# Patient Record
Sex: Male | Born: 1965 | Hispanic: Yes | Marital: Single | Smoking: Never smoker
Health system: Southern US, Community
[De-identification: ages and names within clinical notes are randomized; demographics above are authoritative.]

## PROBLEM LIST (undated history)

## (undated) DIAGNOSIS — Z9989 Dependence on other enabling machines and devices: Secondary | ICD-10-CM

## (undated) DIAGNOSIS — E538 Deficiency of other specified B group vitamins: Secondary | ICD-10-CM

## (undated) DIAGNOSIS — G4733 Obstructive sleep apnea (adult) (pediatric): Secondary | ICD-10-CM

## (undated) DIAGNOSIS — I10 Essential (primary) hypertension: Secondary | ICD-10-CM

## (undated) DIAGNOSIS — J45909 Unspecified asthma, uncomplicated: Secondary | ICD-10-CM

## (undated) DIAGNOSIS — E119 Type 2 diabetes mellitus without complications: Secondary | ICD-10-CM

## (undated) DIAGNOSIS — E785 Hyperlipidemia, unspecified: Secondary | ICD-10-CM

## (undated) DIAGNOSIS — E669 Obesity, unspecified: Secondary | ICD-10-CM

## (undated) DIAGNOSIS — I639 Cerebral infarction, unspecified: Secondary | ICD-10-CM

## (undated) HISTORY — PX: EYE SURGERY: SHX253

## (undated) HISTORY — DX: Cerebral infarction, unspecified: I63.9

## (undated) SURGERY — Surgical Case
Anesthesia: *Unknown

---

## 2015-04-05 ENCOUNTER — Emergency Department: Payer: Commercial Managed Care - PPO

## 2015-04-05 ENCOUNTER — Emergency Department
Admission: EM | Admit: 2015-04-05 | Discharge: 2015-04-05 | Disposition: A | Discharge status: Home / Self Care | Payer: Commercial Managed Care - PPO | Attending: Emergency Medicine | Admitting: Emergency Medicine

## 2015-04-05 ENCOUNTER — Encounter: Payer: Self-pay | Admitting: *Deleted

## 2015-04-05 DIAGNOSIS — R531 Weakness: Secondary | ICD-10-CM | POA: Diagnosis present

## 2015-04-05 DIAGNOSIS — E119 Type 2 diabetes mellitus without complications: Secondary | ICD-10-CM | POA: Diagnosis not present

## 2015-04-05 DIAGNOSIS — Z794 Long term (current) use of insulin: Secondary | ICD-10-CM | POA: Insufficient documentation

## 2015-04-05 DIAGNOSIS — G629 Polyneuropathy, unspecified: Secondary | ICD-10-CM | POA: Diagnosis not present

## 2015-04-05 DIAGNOSIS — I10 Essential (primary) hypertension: Secondary | ICD-10-CM | POA: Diagnosis not present

## 2015-04-05 DIAGNOSIS — Z79899 Other long term (current) drug therapy: Secondary | ICD-10-CM | POA: Insufficient documentation

## 2015-04-05 HISTORY — DX: Hyperlipidemia, unspecified: E78.5

## 2015-04-05 HISTORY — DX: Obesity, unspecified: E66.9

## 2015-04-05 HISTORY — DX: Essential (primary) hypertension: I10

## 2015-04-05 HISTORY — DX: Type 2 diabetes mellitus without complications: E11.9

## 2015-04-05 LAB — CBC WITH DIFFERENTIAL/PLATELET
Basophils Absolute: 0 10*3/uL (ref 0–0.1)
Basophils Relative: 0 %
Eosinophils Absolute: 0.1 10*3/uL (ref 0–0.7)
Eosinophils Relative: 1 %
HCT: 42.7 % (ref 40.0–52.0)
Hemoglobin: 14.4 g/dL (ref 13.0–18.0)
LYMPHS ABS: 2.1 10*3/uL (ref 1.0–3.6)
Lymphocytes Relative: 19 %
MCH: 32.6 pg (ref 26.0–34.0)
MCHC: 33.8 g/dL (ref 32.0–36.0)
MCV: 96.3 fL (ref 80.0–100.0)
MONOS PCT: 8 %
Monocytes Absolute: 0.9 10*3/uL (ref 0.2–1.0)
Neutro Abs: 7.9 10*3/uL — ABNORMAL HIGH (ref 1.4–6.5)
Neutrophils Relative %: 72 %
Platelets: 273 10*3/uL (ref 150–440)
RBC: 4.43 MIL/uL (ref 4.40–5.90)
RDW: 13.3 % (ref 11.5–14.5)
WBC: 11.2 10*3/uL — AB (ref 3.8–10.6)

## 2015-04-05 LAB — COMPREHENSIVE METABOLIC PANEL
ALT: 23 U/L (ref 17–63)
AST: 27 U/L (ref 15–41)
Albumin: 3.9 g/dL (ref 3.5–5.0)
Alkaline Phosphatase: 55 U/L (ref 38–126)
Anion gap: 9 (ref 5–15)
BUN: 16 mg/dL (ref 6–20)
CHLORIDE: 104 mmol/L (ref 101–111)
CO2: 26 mmol/L (ref 22–32)
Calcium: 9 mg/dL (ref 8.9–10.3)
Creatinine, Ser: 0.82 mg/dL (ref 0.61–1.24)
GFR calc Af Amer: >60 mL/min (ref >60)
GFR calc non Af Amer: >60 mL/min (ref >60)
GLUCOSE: 162 mg/dL — AB (ref 65–99)
Potassium: 3.7 mmol/L (ref 3.5–5.1)
Sodium: 139 mmol/L (ref 135–145)
TOTAL PROTEIN: 7.3 g/dL (ref 6.5–8.1)
Total Bilirubin: 0.9 mg/dL (ref 0.3–1.2)

## 2015-04-05 LAB — MAGNESIUM: MAGNESIUM: 1.6 mg/dL — AB (ref 1.7–2.4)

## 2015-04-05 LAB — TROPONIN I
TROPONIN I: 0.03 ng/mL (ref <0.031)
Troponin I: <0.03 ng/mL (ref <0.031)

## 2015-04-05 MED ORDER — MAGNESIUM SULFATE 2 GM/50ML IV SOLN
2.0000 g | Freq: Once | INTRAVENOUS | Status: AC
  Administered 2015-04-05: 2 g via INTRAVENOUS
  Filled 2015-04-05: qty 50

## 2015-04-05 MED ORDER — ASPIRIN 81 MG PO CHEW
324.0000 mg | CHEWABLE_TABLET | Freq: Once | ORAL | Status: AC
  Administered 2015-04-05: 324 mg via ORAL
  Filled 2015-04-05: qty 4

## 2015-04-05 NOTE — ED Notes (Addendum)
Pt ambulatory to triage.  Pt has weakness since 1300 today.  States right leg felt like it was giving out.  States it feels heavy.  No headache. Speech clear.  No n/v/d.  Pt is diabetic.  No dizziness.  Pt states he checked his FSBS earlier and it was 170. Pt to ctscan from triage via wheelchair.

## 2015-04-05 NOTE — ED Notes (Signed)
AAOx3.  Skin warm and dry.   

## 2015-04-05 NOTE — ED Notes (Signed)
AAOx3.  Skin warm and dry.  Moving all extremities equally and strong.  Gait steady. 

## 2015-04-05 NOTE — ED Provider Notes (Signed)
[facility] Emergency Department Provider Note  ____________________________________________  Time seen: Approximately 4:03 PM  I have reviewed the triage vital signs and the nursing notes.   HISTORY  Chief Complaint Weakness    HPI Roger [name] is a 49 y.o. male with a history of obesity, insulin-dependent diabetes, hypertension, and hyperlipidemia who presents with acute onset of right leg weakness/heaviness.  He states that shortly after lunch he had an acute onset of feeling like his right leg would give out, and it buckled once while he was walking.  She will thereafter that feeling evolved into a feeling of heaviness primarily in his right leg, though he wonders if maybe he felt a little bit of weakness in his right arm as well.  He denies headache, numbness/tingling, back pain, chest pain, nausea/vomiting, shortness of breath, fever/chills.  He has not had any issues with word finding or speech.  He describes his symptoms as moderate initially but mild now.   Past Medical History  Diagnosis Date  . Hypertension   . Diabetes mellitus without complication     insulin-dependent  . Hyperlipidemia   . Obesity     There are no active problems to display for this patient.   Past Surgical History  Procedure Laterality Date  . Eye surgery Right     Corneal implant    Current Outpatient Rx  Name  Route  Sig  Dispense  Refill  . Exenatide ER (BYDUREON) 2 MG PEN   Subcutaneous   Inject 2 mg into the skin once a week. On sundays         . furosemide (LASIX) 20 MG tablet   Oral   Take 1 tablet by mouth daily.      0   . insulin glargine (LANTUS) 100 UNIT/ML injection   Subcutaneous   Inject 20-25 Units into the skin 2 (two) times daily. 25 units in the morning and 20 units at bedtime.         Marland Kitchen losartan (COZAAR) 100 MG tablet   Oral   Take 1 tablet by mouth daily.      0   . metFORMIN (GLUCOPHAGE) 500 MG tablet   Oral   Take 2,000  mg by mouth 2 (two) times daily.         . metoprolol succinate (TOPROL-XL) 50 MG 24 hr tablet   Oral   Take 1 tablet by mouth daily.      0   . niacin (NIASPAN) 500 MG CR tablet   Oral   Take 1,500 mg by mouth at bedtime.      0   . simvastatin (ZOCOR) 20 MG tablet   Oral   Take 1 tablet by mouth at bedtime.      0     Allergies Review of patient's allergies indicates no known allergies.  History reviewed. No pertinent family history.  Social History History  Substance Use Topics  . Smoking status: Never Smoker   . Smokeless tobacco: Not on file  . Alcohol Use: No    Review of Systems Constitutional: No fever/chills Eyes: No visual changes. ENT: No sore throat. Cardiovascular: Denies chest pain. Respiratory: Denies shortness of breath. Gastrointestinal: No abdominal pain.  No nausea, no vomiting.  No diarrhea.  No constipation. Genitourinary: Negative for dysuria. Musculoskeletal: Negative for back pain. Skin: Negative for rash. Neurological: No numbness nor tingling.  Patient describes heaviness in his right side, primarily his right leg.  10-point ROS otherwise negative.  ____________________________________________   PHYSICAL EXAM:  VITAL SIGNS: ED Triage Vitals  Enc Vitals Group     BP 04/05/15 1512 182/93 mmHg     Pulse Rate 04/05/15 1512 88     Resp 04/05/15 1512 18     Temp 04/05/15 1512 98.1 F (36.7 C)     Temp Source 04/05/15 1512 Oral     SpO2 04/05/15 1512 99 %     Weight 04/05/15 1512 228 lb (103.42 kg)     Height 04/05/15 1512 5\' 7"  (1.702 m)     Head Cir --      Peak Flow --      Pain Score --      Pain Loc --      Pain Edu? --      Excl. in GC? --     Constitutional: Alert and oriented. Well appearing and in no acute distress. Eyes: Conjunctivae are normal. PERRL. EOMI. Head: Atraumatic. Nose: No congestion/rhinnorhea. Mouth/Throat: Mucous membranes are moist.  Oropharynx non-erythematous. Neck: No stridor.    Cardiovascular: Normal rate, regular rhythm. Grossly normal heart sounds.  Good peripheral circulation. Respiratory: Normal respiratory effort.  No retractions. Lungs CTAB. Gastrointestinal: Soft and nontender. No distention. No abdominal bruits. No CVA tenderness. Musculoskeletal: No lower extremity tenderness nor edema.  No joint effusions. Neurologic:  Normal speech and language. No gross focal neurologic deficits are appreciated:  Cranial nerves are grossly intact.  No dysarthria nor dysphagia.  Brachioradialis and patellar reflexes are normal and equal bilaterally.  Gait is stable and normal.  No cerebellar dysfunction appreciated.  Strength is intact in bilateral upper and lower extremities. Skin:  Skin is warm, dry and intact. No rash noted. Psychiatric: Mood and affect are normal. Speech and behavior are normal.  ____________________________________________   LABS (all labs ordered are listed, but only abnormal results are displayed)  Labs Reviewed  COMPREHENSIVE METABOLIC PANEL - Abnormal; Notable for the following:    Glucose, Bld 162 (*)    All other components within normal limits  MAGNESIUM - Abnormal; Notable for the following:    Magnesium 1.6 (*)    All other components within normal limits  CBC WITH DIFFERENTIAL/PLATELET - Abnormal; Notable for the following:    WBC 11.2 (*)    Neutro Abs 7.9 (*)    All other components within normal limits  TROPONIN I  TROPONIN I   ____________________________________________  EKG  ED ECG REPORT I, Margret Moat, the attending physician, personally viewed and interpreted this ECG.  Date: 04/05/2015 EKG Time: 15:37 Rate: 89 Rhythm: normal sinus rhythm QRS Axis: normal Intervals: normal ST/T Wave abnormalities: normal Conduction Disutrbances: none Narrative Interpretation: unremarkable  ____________________________________________  RADIOLOGY  Aura Camps, Amani Nodarse, personally discussed these images and results by phone  with the on-call radiologist and used this discussion as part of my medical decision making.   Ct Head Wo Contrast  04/05/2015   ADDENDUM REPORT: 04/05/2015 15:47  ADDENDUM: Study discussed by telephone with Dr. Loleta Rose on 04/05/2015 at 1537 hours.   Electronically Signed   By: Odessa Fleming M.D.   On: 04/05/2015 15:47   04/05/2015   CLINICAL DATA:  49 year old male with right side weakness since 1300 hours. Code stroke. Initial encounter.  EXAM: CT HEAD WITHOUT CONTRAST  TECHNIQUE: Contiguous axial images were obtained from the base of the skull through the vertex without intravenous contrast.  COMPARISON:  None.  FINDINGS: Partially visible left maxillary mucous retention cyst. Other Visualized paranasal sinuses and mastoids are clear.  No acute osseous abnormality identified. Visualized orbits and scalp soft tissues are within normal limits.  Mild Calcified atherosclerosis at the skull base. Cerebral volume is within normal limits for age. No midline shift, mass effect, or evidence of intracranial mass lesion. No ventriculomegaly. Normal gray-white matter differentiation. No cortically based acute infarct identified. No acute intracranial hemorrhage identified. No suspicious intracranial vascular hyperdensity.  IMPRESSION: Normal noncontrast CT appearance of the brain.  Electronically Signed: By: Odessa Fleming M.D. On: 04/05/2015 15:37    ____________________________________________   PROCEDURES  Procedure(s) performed: None  Critical Care performed: No   NIH Stroke Scale  Interval: Baseline Time: 7:49 PM Person Administering Scale: Mckenzye Cutright  Administer stroke scale items in the order listed. Record performance in each category after each subscale exam. Do not go back and change scores. Follow directions provided for each exam technique. Scores should reflect what the patient does, not what the clinician thinks the patient can do. The clinician should record answers while administering the exam  and work quickly. Except where indicated, the patient should not be coached (i.e., repeated requests to patient to make a special effort).   1a  Level of consciousness: 0=alert; keenly responsive  1b. LOC questions:  0=Performs both tasks correctly  1c. LOC commands: 0=Performs both tasks correctly  2.  Best Gaze: 0=normal  3.  Visual: 0=No visual loss  4. Facial Palsy: 0=Normal symmetric movement  5a.  Motor left arm: 0=No drift, limb holds 90 (or 45) degrees for full 10 seconds  5b.  Motor right arm: 0=No drift, limb holds 90 (or 45) degrees for full 10 seconds  6a. motor left leg: 0=No drift, limb holds 90 (or 45) degrees for full 10 seconds  6b  Motor right leg:  0=No drift, limb holds 90 (or 45) degrees for full 10 seconds  7. Limb Ataxia: 0=Absent  8.  Sensory: 0=Normal; no sensory loss  9. Best Language:  0=No aphasia, normal  10. Dysarthria: 0=Normal  11. Extinction and Inattention: 0=No abnormality  12. Distal motor function: 0=Normal   Total:   0   The patient's NIH stroke scale is 0 and he does not qualify for TPA because I do not believe he is having a CVA, his symptoms are mild, and they are improving. ____________________________________________      INITIAL IMPRESSION / ASSESSMENT AND PLAN / ED COURSE  Pertinent labs & imaging results that were available during my care of the patient were reviewed by me and considered in my medical decision making (see chart for details).  The patient has normal vital signs other than hypertension, has a normal head CT which I discussed by phone with radiologist, and has no appreciable neurological deficits on extensive evaluation.  I do not believe there is indication for additional neurological imaging at this time.  While it is possible that he may have suffered a TIA, I find this to be unlikely given his age and unremarkable exam and only mildly suggestive history.  He has no back pain to suggest a nerve or cord compression.  He  has no infectious signs or symptoms as well.  ----------------------------------------- 5:13 PM on 04/05/2015 -----------------------------------------  The patient is "fine" on reassessment.  His labs were notable only for a slight magnesium deficiency and a troponin of 0.03.  He has no signs or symptoms of ACS.  I will repeat a troponin at the 3 hour mark to make sure that it remained stable.  I will also provide some  additional magnesium.  I informed the patient that we are repeating the labs to make sure that nothing changes over time and he is comfortable with this plan.  ----------------------------------------- 7:49 PM on 04/05/2015 -----------------------------------------  The patient feels better and reports improvement of his leg heaviness.  We discussed his results again including his 2 negative troponins.  He will follow up as an outpatient.  I recommended that he take aspirin as an outpatient and follow up with his primary care doctor to determine if additional outpatient evaluation is needed.  I gave him and his mother my usual and customary return precautions.    ____________________________________________  FINAL CLINICAL IMPRESSION(S) / ED DIAGNOSES  Final diagnoses:  Peripheral neuropathy      NEW MEDICATIONS STARTED DURING THIS VISIT:  New Prescriptions   No medications on file     Loleta Rose, MD 04/05/15 1949

## 2015-04-05 NOTE — ED Notes (Signed)
AAOx3.  Skin warm and dry.  Moving all extremities equally and strong. Ambulates with easy and steady gait.   

## 2015-04-05 NOTE — Discharge Instructions (Signed)
As we discussed, your workup today was reassuring.  The we do not know exactly what is causing your symptoms, it appears that you have no emergent medical condition at this time are safe to go home and follow up as recommended in this paperwork.  While it is POSSIBLE that you may have peripheral neuropathy or, less likely, a transient ischemic attack (TIA), we included information for you to read about other conditions.  It is important that you follow-up with her primary care doctor at the next available opportunity to decide if additional testing as an outpatient is needed.  In the meantime, I recommend that you take a daily full dose (325 mg) aspirin.  Please return immediately to the Emergency Department if you develop any new or worsening symptoms that concern you.

## 2015-04-25 ENCOUNTER — Telehealth: Payer: Self-pay | Admitting: Neurology

## 2015-04-25 ENCOUNTER — Encounter: Payer: Self-pay | Admitting: Neurology

## 2015-04-25 ENCOUNTER — Ambulatory Visit (INDEPENDENT_AMBULATORY_CARE_PROVIDER_SITE_OTHER): Payer: Commercial Managed Care - PPO | Admitting: Neurology

## 2015-04-25 ENCOUNTER — Telehealth: Payer: Self-pay

## 2015-04-25 VITALS — BP 158/97 | HR 87 | Ht 67.0 in | Wt 230.2 lb

## 2015-04-25 DIAGNOSIS — G5602 Carpal tunnel syndrome, left upper limb: Secondary | ICD-10-CM | POA: Diagnosis not present

## 2015-04-25 DIAGNOSIS — R202 Paresthesia of skin: Secondary | ICD-10-CM | POA: Diagnosis not present

## 2015-04-25 DIAGNOSIS — E538 Deficiency of other specified B group vitamins: Secondary | ICD-10-CM | POA: Insufficient documentation

## 2015-04-25 DIAGNOSIS — R260 Ataxic gait: Secondary | ICD-10-CM | POA: Diagnosis not present

## 2015-04-25 DIAGNOSIS — G5600 Carpal tunnel syndrome, unspecified upper limb: Secondary | ICD-10-CM | POA: Insufficient documentation

## 2015-04-25 MED ORDER — TOPIRAMATE 50 MG PO TABS: 100.0000 mg | ORAL_TABLET | Freq: Two times a day (BID) | ORAL | Status: DC

## 2015-04-25 NOTE — Telephone Encounter (Signed)
Patient called, Dr. Dario Guardian needs something in writing from Dr. Pearlean Brownie stating that it's ok to take B12 shots. Pt would like for this to be taken care of ASAP.

## 2015-04-25 NOTE — Patient Instructions (Addendum)
I had a long discussion with the patient regarding his symptoms of hand paresthesias as well as new complaints of gait ataxia and falling. These could perhaps related to B12 deficiency but given progressive nature I would like to image his brain and cervical spine and rule out other structural or vascular causes. On exam he also has a component of carpal tunnel syndrome in the left hand. Check MRI scan of the brain, cervical spine, EMG nerve conduction study, B12, TSH, ANA and ESR. I have advised the patient to wear wrist extension splint on the left wrist and all the times for carpal tunnel. Trial of Topamax 50 mg at night for 1 week increase if tolerated without side effects to twice daily. I discussed possible side effects with the patient. He was advised to return for follow-up in 2 months or call earlier if necessary.  Carpal Tunnel Syndrome The carpal tunnel is a narrow area located on the palm side of your wrist. The tunnel is formed by the wrist bones and ligaments. Nerves, blood vessels, and tendons pass through the carpal tunnel. Repeated wrist motion or certain diseases may cause swelling within the tunnel. This swelling pinches the main nerve in the wrist (median nerve) and causes the painful hand and arm condition called carpal tunnel syndrome. CAUSES   Repeated wrist motions.  Wrist injuries.  Certain diseases like arthritis, diabetes, alcoholism, hyperthyroidism, and kidney failure.  Obesity.  Pregnancy. SYMPTOMS   A "pins and needles" feeling in your fingers or hand, especially in your thumb, index and middle fingers.  Tingling or numbness in your fingers or hand.  An aching feeling in your entire arm, especially when your wrist and elbow are bent for long periods of time.  Wrist pain that goes up your arm to your shoulder.  Pain that goes down into your palm or fingers.  A weak feeling in your hands. DIAGNOSIS  Your health care provider will take your history and perform  a physical exam. An electromyography test may be needed. This test measures electrical signals sent out by your nerves into the muscles. The electrical signals are usually slowed by carpal tunnel syndrome. You may also need X-rays. TREATMENT  Carpal tunnel syndrome may clear up by itself. Your health care provider may recommend a wrist splint or medicine such as a nonsteroidal anti-inflammatory medicine. Cortisone injections may help. Sometimes, surgery may be needed to free the pinched nerve.  HOME CARE INSTRUCTIONS   Take all medicine as directed by your health care provider. Only take over-the-counter or prescription medicines for pain, discomfort, or fever as directed by your health care provider.  If you were given a splint to keep your wrist from bending, wear it as directed. It is important to wear the splint at night. Wear the splint for as long as you have pain or numbness in your hand, arm, or wrist. This may take 1 to 2 months.  Rest your wrist from any activity that may be causing your pain. If your symptoms are work-related, you may need to talk to your employer about changing to a job that does not require using your wrist.  Put ice on your wrist after long periods of wrist activity.  Put ice in a plastic bag.  Place a towel between your skin and the bag.  Leave the ice on for 15-20 minutes, 03-04 times a day.  Keep all follow-up visits as directed by your health care provider. This includes any orthopedic referrals, physical therapy, and  rehabilitation. Any delay in getting necessary care could result in a delay or failure of your condition to heal. SEEK IMMEDIATE MEDICAL CARE IF:   You have new, unexplained symptoms.  Your symptoms get worse and are not helped or controlled with medicines. MAKE SURE YOU:   Understand these instructions.  Will watch your condition.  Will get help right away if you are not doing well or get worse. Document Released: 08/10/2000 Document  Revised: 12/28/2013 Document Reviewed: 06/29/2011 [facility] Patient Information 2015 [place], [place]. This information is not intended to replace advice given to you by your health care provider. Make sure you discuss any questions you have with your health care provider.

## 2015-04-25 NOTE — Progress Notes (Signed)
[facility] [address] [place]. [place] [address] [phone]       OFFICE CONSULT NOTE  Mr. Lyndall Windt Date of Birth:  [birth year] Medical Record Number:  [id number]   Referring MD:   Rosario Jacks Reason for Referral:  Tingling and gait ataxia HPI:  Mr Manetta is a 49 year old male whose had for the last 3 weeks subacute progressive paresthesias mostly in the left hand but to lesser degree in the right hand as well. He states this began about 3 weeks ago when he noticed weakness on his whole left side not just his hand this lasted only 10 minutes and recovered he subsequently had tickling sensation in his left hand and to lesser extent the right hand. Next a noticed a little heaviness on the right side and his right leg was buckling. He was seen in the emergency room at [facility] on 04/05/2015 and had unremarkable exam. Basic metabolic panel labs and CBC unremarkable. CT scan of the head which have personally reviewed was normal. He was subsequently seen by his primary physician and underwent a bunch of tests including carotid ultrasound, transthoracic echo and nuclear stress test all of which were normal. I do not have the results to review today. He had lab work done on 04/05/15 growing vitamin B12 which came slightly low-normal at 209. Patient has noticed for the last several days that he stumbles a lot. His gait is off balance. At times he can even drop objects from his left hand is he has so much tingling. This tingling seems to be more pronounced at night and at times he cannot sleep because of this. 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 He does have long-standing history of diabetes in which was poorly controlled in the past with lasted about an A1c being 7.9 but after being started on insulin he is doing better and more recently his fasting sugars have ranged from 90-120. He does have history of mild tingling  in his feet but he does not feel that this has gotten worse. He has not tried any specific medications for paresthesias in his hand. He denies history of any head injury, headache, neck pain, radicular pain. He does admit to significant fatigue over the last several months but denies any loss of vision, vertigo, diplopia, bladder urgency or incontinence.  ROS:   14 system review of systems is positive for tingling, numbness, gait imbalance, frequent falls, fatigue and all other systems negative  PMH:  Past Medical History  Diagnosis Date  . Hypertension   . Diabetes mellitus without complication     insulin-dependent  . Hyperlipidemia   . Obesity     Social History:  Social History   Social History  . Marital Status: Single    Spouse Name: N/A  . Number of Children: N/A  . Years of Education: N/A   Occupational History  . Not on file.   Social History Main Topics  . Smoking status: Never Smoker   . Smokeless tobacco: Not on file  . Alcohol Use: No  . Drug Use: Not on file  . Sexual Activity: Not on file   Other Topics Concern  . Not on file   Social History Narrative    Medications:   Current Outpatient Prescriptions on File Prior to Visit  Medication Sig Dispense Refill  . Exenatide ER (BYDUREON) 2 MG PEN Inject 2 mg into the skin once  a week. On sundays    . furosemide (LASIX) 20 MG tablet Take 1 tablet by mouth daily.  0  . insulin glargine (LANTUS) 100 UNIT/ML injection Inject 20-25 Units into the skin 2 (two) times daily. 25 units in the morning and 20 units at bedtime.    Marland Kitchen losartan (COZAAR) 100 MG tablet Take 1 tablet by mouth daily.  0  . metFORMIN (GLUCOPHAGE) 500 MG tablet Take 2,000 mg by mouth 2 (two) times daily.    . metoprolol succinate (TOPROL-XL) 50 MG 24 hr tablet Take 1 tablet by mouth daily.  0  . niacin (NIASPAN) 500 MG CR tablet Take 1,500 mg by mouth at bedtime.  0  . simvastatin (ZOCOR) 20 MG tablet Take 1 tablet by mouth at bedtime.  0   No  current facility-administered medications on file prior to visit.    Allergies:  No Known Allergies  Physical Exam General: Obese young male seated, in no evident distress Head: head normocephalic and atraumatic.   Neck: supple with no carotid or supraclavicular bruits Cardiovascular: regular rate and rhythm, no murmurs Musculoskeletal: no deformity Skin:  no rash/petichiae Vascular:  Normal pulses all extremities  Neurologic Exam Mental Status: Awake and fully alert. Oriented to place and time. Recent and remote memory intact. Attention span, concentration and fund of knowledge appropriate. Mood and affect appropriate.  Cranial Nerves: Fundoscopic exam reveals sharp disc margins. Pupils equal, briskly reactive to light. Extraocular movements full without nystagmus. Visual fields full to confrontation. Hearing intact. Facial sensation intact. Face, tongue, palate moves normally and symmetrically.  Motor: Normal bulk and tone. Normal strength in all tested extremity muscles. Sensory.: intact to touch , pinprick , position and vibratory sensation. Positive Tinel's sign over left wrist Coordination: Rapid alternating movements normal in all extremities. Finger-to-nose and heel-to-shin performed accurately bilaterally. Gait and Station: Arises from chair with slight difficulty. Stance is broad based. Gait demonstrates mild imbalance . Unable to heel, toe and tandem walk without difficulty.  Reflexes: 2+  brsik and symmetric. Right Plantar downgoing and left is equivocal.       ASSESSMENT: 49 year male with three-week history of progressive left greater than right hand paresthesias as well as more recently gait and balance difficulties with frequent falls of undetermined etiology. Recent finding of B12 deficiency may be contributing but focal symptoms with left hand paresthesias and right leg buckling would be unusual and evaluation for other causes is necessary. He does have signs and symptoms  of carpal tunnel in the left wrist.    PLAN: I had a long discussion with the patient regarding his symptoms of hand paresthesias as well as new complaints of gait ataxia and falling. These could perhaps  at least partly berelated to B12 deficiency but given progressive nature I would like to image his brain and cervical spine and rule out other structural or vascular causes. On exam he also has a component of carpal tunnel syndrome in the left hand. Check MRI scan of the brain, cervical spine, EMG nerve conduction study, B12, methylmalonic acid,TSH, ANA and ESR. I have advised the patient to wear wrist extension splint on the left wrist and all the times for carpal tunnel. Trial of Topamax 50 mg at night for 1 week increase if tolerated without side effects to twice daily. I discussed possible side effects with the patient. I have also advised the patient to start B12 injections from his primary physician. Greater than 50% of time during this 45 minute consult  was spent on counseling and coordination of care. He was advised to return for follow-up in 2 months or call earlier if necessary.  Antony Contras, MD Note: This document was prepared with digital dictation and possible smart phrase technology. Any transcriptional errors that result from this process are unintentional.

## 2015-04-25 NOTE — Telephone Encounter (Signed)
RN call patient back to discuss his letter for his primary concerning vitamin b 12 shot. Rn also notify patient that he had blood work order from Dr. Pearlean Brownie. Pt stated he will try to come back for blood work in the coming weeks.While on the phone, the call became disconnected.

## 2015-04-25 NOTE — Telephone Encounter (Signed)
If patients call back he needs to come in to get his blood work done. He saw Dr Pearlean Brownie for an appt on 04-25-15.   Lft vm for patient about getting his labs done. Pt has visit and did not get his blood work drawn.

## 2015-04-25 NOTE — Telephone Encounter (Signed)
Nurse left voce message for patient to return phone call. Nurse needs fax number and phone number for his PCP.

## 2015-04-26 NOTE — Telephone Encounter (Signed)
Rn receive call from patient concerning letter for his primary doctor. Pt wants the letter to state that its okay for him to take the b12 injections per Dr. Pearlean Brownie. Number for Dr. Dario Guardian is [phone].

## 2015-04-26 NOTE — Telephone Encounter (Signed)
Okay to do the letter saying that patient should get B12 shots from his primary physician and he should not drive till he feels better

## 2015-04-27 NOTE — Telephone Encounter (Signed)
Patient is calling back about getting in today for B12 shot here in our office vs. PCP office since PCP is gone today.

## 2015-04-27 NOTE — Telephone Encounter (Signed)
Nurse call patients primary doctor Dr. Sherrie Mustache office. The front desk stated the office is closed and Dr.fayegh Dario Guardian will be in the office tomorrow. Letter was fax to md to get approval to receive b12 shots from his PCP. Nurse ask Dr. Pearlean Brownie if patient could receive b12 at [facility] and he referred the patient back to his primary.

## 2015-04-27 NOTE — Telephone Encounter (Signed)
Patient called regarding blood work to be done. Please call and advise. He can be reached at [phone]

## 2015-04-27 NOTE — Telephone Encounter (Signed)
Patient called back requesting to get B12 shot here in our office today and labwork at same time since his PCP is out of the office. Patient states "He can barely walk".

## 2015-04-28 NOTE — Telephone Encounter (Signed)
Rn talk to patient about his b12 injection. Pt stated he went to a different doctor and receive the injection. Pt stated he is fine now,and will come in next week to have his lab work done [facility] that he forgot at the last visit.

## 2015-05-13 ENCOUNTER — Emergency Department: Payer: Commercial Managed Care - PPO

## 2015-05-13 ENCOUNTER — Inpatient Hospital Stay
Admission: EM | Admit: 2015-05-13 | Discharge: 2015-05-16 | DRG: 066 | Disposition: A | Discharge status: Home / Self Care | Payer: Commercial Managed Care - PPO | Attending: Internal Medicine | Admitting: Internal Medicine

## 2015-05-13 ENCOUNTER — Encounter: Payer: Self-pay | Admitting: Internal Medicine

## 2015-05-13 DIAGNOSIS — J45909 Unspecified asthma, uncomplicated: Secondary | ICD-10-CM | POA: Diagnosis present

## 2015-05-13 DIAGNOSIS — I639 Cerebral infarction, unspecified: Secondary | ICD-10-CM

## 2015-05-13 DIAGNOSIS — E538 Deficiency of other specified B group vitamins: Secondary | ICD-10-CM | POA: Diagnosis present

## 2015-05-13 DIAGNOSIS — E119 Type 2 diabetes mellitus without complications: Secondary | ICD-10-CM | POA: Diagnosis present

## 2015-05-13 DIAGNOSIS — Z794 Long term (current) use of insulin: Secondary | ICD-10-CM | POA: Diagnosis not present

## 2015-05-13 DIAGNOSIS — E785 Hyperlipidemia, unspecified: Secondary | ICD-10-CM | POA: Diagnosis present

## 2015-05-13 DIAGNOSIS — Z833 Family history of diabetes mellitus: Secondary | ICD-10-CM | POA: Diagnosis not present

## 2015-05-13 DIAGNOSIS — I63519 Cerebral infarction due to unspecified occlusion or stenosis of unspecified middle cerebral artery: Secondary | ICD-10-CM | POA: Diagnosis present

## 2015-05-13 DIAGNOSIS — Z7982 Long term (current) use of aspirin: Secondary | ICD-10-CM

## 2015-05-13 DIAGNOSIS — I340 Nonrheumatic mitral (valve) insufficiency: Secondary | ICD-10-CM | POA: Diagnosis present

## 2015-05-13 DIAGNOSIS — R4701 Aphasia: Secondary | ICD-10-CM | POA: Diagnosis present

## 2015-05-13 DIAGNOSIS — Z6835 Body mass index (BMI) 35.0-35.9, adult: Secondary | ICD-10-CM | POA: Diagnosis not present

## 2015-05-13 DIAGNOSIS — E669 Obesity, unspecified: Secondary | ICD-10-CM | POA: Diagnosis present

## 2015-05-13 DIAGNOSIS — G4733 Obstructive sleep apnea (adult) (pediatric): Secondary | ICD-10-CM | POA: Diagnosis present

## 2015-05-13 DIAGNOSIS — Z9989 Dependence on other enabling machines and devices: Secondary | ICD-10-CM

## 2015-05-13 DIAGNOSIS — I10 Essential (primary) hypertension: Secondary | ICD-10-CM | POA: Diagnosis present

## 2015-05-13 DIAGNOSIS — G459 Transient cerebral ischemic attack, unspecified: Secondary | ICD-10-CM | POA: Diagnosis present

## 2015-05-13 DIAGNOSIS — R130 Aphagia: Secondary | ICD-10-CM | POA: Diagnosis present

## 2015-05-13 HISTORY — DX: Dependence on other enabling machines and devices: Z99.89

## 2015-05-13 HISTORY — DX: Deficiency of other specified B group vitamins: E53.8

## 2015-05-13 HISTORY — DX: Unspecified asthma, uncomplicated: J45.909

## 2015-05-13 HISTORY — DX: Obstructive sleep apnea (adult) (pediatric): G47.33

## 2015-05-13 LAB — COMPREHENSIVE METABOLIC PANEL
ALK PHOS: 60 U/L (ref 38–126)
ALT: 18 U/L (ref 17–63)
AST: 24 U/L (ref 15–41)
Albumin: 4.3 g/dL (ref 3.5–5.0)
Anion gap: 10 (ref 5–15)
BUN: 14 mg/dL (ref 6–20)
CALCIUM: 9.5 mg/dL (ref 8.9–10.3)
CO2: 27 mmol/L (ref 22–32)
CREATININE: 0.82 mg/dL (ref 0.61–1.24)
Chloride: 103 mmol/L (ref 101–111)
Glucose, Bld: 122 mg/dL — ABNORMAL HIGH (ref 65–99)
Potassium: 3.7 mmol/L (ref 3.5–5.1)
SODIUM: 140 mmol/L (ref 135–145)
Total Bilirubin: 0.9 mg/dL (ref 0.3–1.2)
Total Protein: 7.8 g/dL (ref 6.5–8.1)

## 2015-05-13 LAB — CBC
HEMATOCRIT: 44.3 % (ref 40.0–52.0)
Hemoglobin: 15.2 g/dL (ref 13.0–18.0)
MCH: 33.1 pg (ref 26.0–34.0)
MCHC: 34.4 g/dL (ref 32.0–36.0)
MCV: 96.4 fL (ref 80.0–100.0)
Platelets: 278 10*3/uL (ref 150–440)
RBC: 4.6 MIL/uL (ref 4.40–5.90)
RDW: 13.5 % (ref 11.5–14.5)
WBC: 11.7 10*3/uL — AB (ref 3.8–10.6)

## 2015-05-13 LAB — DIFFERENTIAL
Basophils Absolute: 0.1 10*3/uL (ref 0–0.1)
Basophils Relative: 0 %
Eosinophils Absolute: 0.2 10*3/uL (ref 0–0.7)
Eosinophils Relative: 1 %
LYMPHS PCT: 21 %
Lymphs Abs: 2.5 10*3/uL (ref 1.0–3.6)
MONO ABS: 1 10*3/uL (ref 0.2–1.0)
MONOS PCT: 9 %
NEUTROS ABS: 8 10*3/uL — AB (ref 1.4–6.5)
Neutrophils Relative %: 69 %

## 2015-05-13 LAB — PROTIME-INR
INR: 0.86
Prothrombin Time: 11.9 seconds (ref 11.4–15.0)

## 2015-05-13 LAB — GLUCOSE, CAPILLARY: Glucose-Capillary: 131 mg/dL — ABNORMAL HIGH (ref 65–99)

## 2015-05-13 LAB — APTT: aPTT: 26 seconds (ref 24–36)

## 2015-05-13 MED ORDER — ONDANSETRON HCL 4 MG/2ML IJ SOLN: 4.0000 mg | Freq: Four times a day (QID) | INTRAMUSCULAR | Status: DC | PRN

## 2015-05-13 MED ORDER — ACETAMINOPHEN 325 MG PO TABS: 650.0000 mg | ORAL_TABLET | Freq: Four times a day (QID) | ORAL | Status: DC | PRN

## 2015-05-13 MED ORDER — ACETAMINOPHEN 650 MG RE SUPP: 650.0000 mg | Freq: Four times a day (QID) | RECTAL | Status: DC | PRN

## 2015-05-13 MED ORDER — INSULIN ASPART 100 UNIT/ML ~~LOC~~ SOLN
0.0000 [IU] | Freq: Every day | SUBCUTANEOUS | Status: DC
  Administered 2015-05-15: 22:00:00 2 [IU] via SUBCUTANEOUS
  Filled 2015-05-13: qty 2

## 2015-05-13 MED ORDER — VITAMIN B-12 1000 MCG PO TABS
1000.0000 ug | ORAL_TABLET | Freq: Every day | ORAL | Status: DC
  Administered 2015-05-14 – 2015-05-16 (×3): 1000 ug via ORAL
  Filled 2015-05-13 (×3): qty 1

## 2015-05-13 MED ORDER — SODIUM CHLORIDE 0.9 % IJ SOLN
3.0000 mL | Freq: Two times a day (BID) | INTRAMUSCULAR | Status: DC
Start: 2015-05-14 — End: 2015-05-16
  Administered 2015-05-14 – 2015-05-16 (×5): 3 mL via INTRAVENOUS

## 2015-05-13 MED ORDER — INSULIN ASPART 100 UNIT/ML ~~LOC~~ SOLN
0.0000 [IU] | Freq: Three times a day (TID) | SUBCUTANEOUS | Status: DC
  Administered 2015-05-14 – 2015-05-15 (×4): 2 [IU] via SUBCUTANEOUS
  Administered 2015-05-15 (×2): 1 [IU] via SUBCUTANEOUS
  Administered 2015-05-16: 3 [IU] via SUBCUTANEOUS
  Administered 2015-05-16: 10:00:00 2 [IU] via SUBCUTANEOUS
  Filled 2015-05-13: qty 2
  Filled 2015-05-13: qty 1
  Filled 2015-05-13: qty 2
  Filled 2015-05-13: qty 1
  Filled 2015-05-13: qty 3
  Filled 2015-05-13: qty 2
  Filled 2015-05-13: qty 1
  Filled 2015-05-13: qty 2

## 2015-05-13 MED ORDER — ONDANSETRON HCL 4 MG PO TABS: 4.0000 mg | ORAL_TABLET | Freq: Four times a day (QID) | ORAL | Status: DC | PRN

## 2015-05-13 MED ORDER — ENOXAPARIN SODIUM 40 MG/0.4ML ~~LOC~~ SOLN
40.0000 mg | Freq: Every day | SUBCUTANEOUS | Status: DC
  Administered 2015-05-14 – 2015-05-15 (×2): 40 mg via SUBCUTANEOUS
  Filled 2015-05-13 (×2): qty 0.4

## 2015-05-13 MED ORDER — STROKE: EARLY STAGES OF RECOVERY BOOK
Freq: Once | Status: AC
  Administered 2015-05-14: 01:00:00

## 2015-05-13 NOTE — ED Notes (Signed)
PT reports sitting at desk around 445p, suddenly his hands started tingling and he became aphasic. Now reports that he is back to baseline. No deficits noted in triage.

## 2015-05-13 NOTE — H&P (Signed)
[facility] [place] at [facility]   PATIENT NAME: Roger [name]    MR#:  [id number]  DATE OF BIRTH:  [birth year]  DATE OF ADMISSION:  05/13/2015  PRIMARY CARE PHYSICIAN: Sherrie Mustache, MD   REQUESTING/REFERRING PHYSICIAN: Derrill Kay, M.D.  CHIEF COMPLAINT:   Chief Complaint  Patient presents with  . Aphasia    HISTORY OF PRESENT ILLNESS:  Roger [name]  is a 49 y.o. male who presents with an episode of expressive aphasia. The patient has had multiple neurological complaints over the past several weeks, which he has been getting worked up in the outpatient setting. He complained initially of peripheral paresthesias, specifically left hand numbness, right arm heaviness, some leg paresthesias with some difficulty walking. He saw his prior care physician and her neurologist, was diagnosed with low vitamin B12 level and started on supplements. However he also had outpatient MRI, carotid ultrasound, echocardiogram ordered. Per his report the carotid ultrasound and echocardiogram were done and were within normal limits, and he was waiting to have his MRI done. He states that his symptoms started improving once he started taking the B12 supplements. However, earlier today he was at work and began experiencing some expressive aphasia. He states that he knew anyone to say, but was aware that he was not saying the words that he was thinking. He was told by his coworkers that he was speaking gibberish. Patient states that he felt a "fog" over his mind, and thought that this episode lasted just a few moments, but that his coworkers told him afterwards that the episode lasted more like 15 minutes. He came to ED for further evaluation. Initial labs, EKG, and CT head without contrast on within normal limits, patient's symptoms have resolved at this time, but hospitalists were called for admission for TIA/likely CVA.  PAST MEDICAL HISTORY:   Past Medical History  Diagnosis Date  .  Hypertension   . Diabetes mellitus without complication     insulin-dependent  . Hyperlipidemia   . Obesity   . OSA on CPAP   . RAD (reactive airway disease)   . B12 deficiency     PAST SURGICAL HISTORY:   Past Surgical History  Procedure Laterality Date  . Eye surgery Right     Corneal implant    SOCIAL HISTORY:   Social History  Substance Use Topics  . Smoking status: Never Smoker   . Smokeless tobacco: Not on file  . Alcohol Use: No    FAMILY HISTORY:   Family History  Problem Relation Age of Onset  . Diabetes Mother     DRUG ALLERGIES:  No Known Allergies  MEDICATIONS AT HOME:   Prior to Admission medications   Medication Sig Start Date End Date Taking? Authorizing Provider  albuterol (PROVENTIL HFA;VENTOLIN HFA) 108 (90 BASE) MCG/ACT inhaler Inhale 2 puffs into the lungs every 6 (six) hours as needed for wheezing or shortness of breath.   Yes Historical Provider, MD  amLODipine (NORVASC) 5 MG tablet Take 5 mg by mouth daily.   Yes Historical Provider, MD  aspirin EC 81 MG tablet Take 81 mg by mouth daily.   Yes Historical Provider, MD  Exenatide ER (BYDUREON) 2 MG SRER Inject 2 mg into the skin once a week. Pt uses on Sunday.   Yes Historical Provider, MD  furosemide (LASIX) 20 MG tablet Take 40 mg by mouth daily.    Yes Historical Provider, MD  insulin glargine (LANTUS) 100 UNIT/ML injection Inject 20-25 Units into the  skin 2 (two) times daily. Pt uses 25 units in the morning and 20 units at bedtime.   Yes Historical Provider, MD  losartan (COZAAR) 100 MG tablet Take 100 mg by mouth daily.   Yes Historical Provider, MD  metFORMIN (GLUCOPHAGE) 1000 MG tablet Take 2,000 mg by mouth 2 (two) times daily.   Yes Historical Provider, MD  metoprolol succinate (TOPROL-XL) 50 MG 24 hr tablet Take 75 mg by mouth daily.    Yes Historical Provider, MD  niacin (NIASPAN) 500 MG CR tablet Take 1,500 mg by mouth at bedtime.   Yes Historical Provider, MD  simvastatin (ZOCOR) 20  MG tablet Take 20 mg by mouth at bedtime.   Yes Historical Provider, MD  topiramate (TOPAMAX) 50 MG tablet Take 2 tablets (100 mg total) by mouth 2 (two) times daily. 04/25/15  Yes Micki Riley, MD    REVIEW OF SYSTEMS:  Review of Systems  Constitutional: Negative for fever, chills, weight loss and malaise/fatigue.  HENT: Negative for ear pain, hearing loss and tinnitus.   Eyes: Negative for blurred vision, double vision, pain and redness.  Respiratory: Negative for cough, hemoptysis and shortness of breath.   Cardiovascular: Negative for chest pain, palpitations, orthopnea and leg swelling.  Gastrointestinal: Negative for nausea, vomiting, abdominal pain, diarrhea and constipation.  Genitourinary: Negative for dysuria, frequency and hematuria.  Musculoskeletal: Negative for back pain, joint pain and neck pain.  Skin:       No acne, rash, or lesions  Neurological: Positive for sensory change, speech change and weakness. Negative for dizziness, tremors and focal weakness.  Endo/Heme/Allergies: Negative for polydipsia. Does not bruise/bleed easily.  Psychiatric/Behavioral: Negative for depression. The patient is not nervous/anxious and does not have insomnia.      VITAL SIGNS:   Filed Vitals:   05/13/15 1856 05/13/15 1858 05/13/15 2135 05/13/15 2200  BP: 163/99  148/88 157/100  Pulse: 88  86 90  Temp:  98.4 F (36.9 C)    TempSrc:      Resp: 17  11 20   Height:      Weight:      SpO2: 98%  98% 98%   Wt Readings from Last 3 Encounters:  05/13/15 101.606 kg (224 lb)  04/25/15 104.418 kg (230 lb 3.2 oz)  04/05/15 103.42 kg (228 lb)    PHYSICAL EXAMINATION:  Physical Exam  Vitals reviewed. Constitutional: He is oriented to person, place, and time. He appears well-developed and well-nourished. No distress.  HENT:  Head: Normocephalic and atraumatic.  Mouth/Throat: Oropharynx is clear and moist.  Eyes: Conjunctivae and EOM are normal. Pupils are equal, round, and reactive to  light. No scleral icterus.  Neck: Normal range of motion. Neck supple. No JVD present. No thyromegaly present.  Cardiovascular: Normal rate, regular rhythm and intact distal pulses.  Exam reveals no gallop and no friction rub.   No murmur heard. Respiratory: Effort normal and breath sounds normal. No respiratory distress. He has no wheezes. He has no rales.  GI: Soft. Bowel sounds are normal. He exhibits no distension. There is no tenderness.  Musculoskeletal: Normal range of motion. He exhibits no edema.  No arthritis, no gout  Lymphadenopathy:    He has no cervical adenopathy.  Neurological: He is alert and oriented to person, place, and time.  Neurologic: Cranial nerves II-XII intact, Sensation intact to light touch/pinprick, 5/5 strength in all extremities, no dysarthria, no aphasia, no dysphagia, memory intact, finger to nose testing showed no abnormality, no pronator drift, DTR  intact, Babinski sign not present.   Skin: Skin is warm and dry. No rash noted. No erythema.  Psychiatric: He has a normal mood and affect. His behavior is normal. Judgment and thought content normal.    LABORATORY PANEL:   CBC  Recent Labs Lab 05/13/15 1807  WBC 11.7*  HGB 15.2  HCT 44.3  PLT 278   ------------------------------------------------------------------------------------------------------------------  Chemistries   Recent Labs Lab 05/13/15 1807  NA 140  K 3.7  CL 103  CO2 27  GLUCOSE 122*  BUN 14  CREATININE 0.82  CALCIUM 9.5  AST 24  ALT 18  ALKPHOS 60  BILITOT 0.9   ------------------------------------------------------------------------------------------------------------------  Cardiac Enzymes No results for input(s): TROPONINI in the last 168 hours. ------------------------------------------------------------------------------------------------------------------  RADIOLOGY:  Ct Head Wo Contrast  05/13/2015   CLINICAL DATA:  Aphasia  EXAM: CT HEAD WITHOUT CONTRAST   TECHNIQUE: Contiguous axial images were obtained from the base of the skull through the vertex without intravenous contrast.  COMPARISON:  04/05/2015  FINDINGS: No skull fracture is noted. Paranasal sinuses and mastoid air cells are unremarkable.  No intracranial hemorrhage, mass effect or midline shift.  No acute cortical infarction. No mass lesion is noted in this unenhanced scan. The gray and white-matter differentiation is preserved. No intra or extra-axial fluid collection.  IMPRESSION: No acute intracranial abnormality.  No significant change.   Electronically Signed   By: Natasha Mead M.D.   On: 05/13/2015 18:27    EKG:   Orders placed or performed during the hospital encounter of 05/13/15  . ED EKG  . ED EKG    IMPRESSION AND PLAN:  Principal Problem:   TIA (transient ischemic attack) - this is possibly more likely true stroke. Unclear at this time how much of the patient's prior neurological symptoms were related to his B12 deficiency versus possible TIA or small stroke. However, given his new symptom of expressive aphasia, stroke workup is required. He reports having carotid ultrasound and echocardiogram which were unremarkable recently in the outpatient setting, so we will request his records tomorrow rather than reordering these tests at this time. We will get fasting lipid panel, hemoglobin A1c, MRI/MRA brain, and neurology consult. His symptoms have resolved at this time. No report of dysarthria, however we will keep him nothing by mouth until he passes RN swallow screen. Active Problems:   HTN (hypertension) - permissive hypertension to BP less than 220/110 for the first 24 hours, then goal of less than 160/100. He will need his antihypertensives restarted after the first 24 hours.   OSA on CPAP - CPAP daily at bedtime while inpatient.   Type 2 diabetes mellitus - sliding scale insulin with appropriate fingerstick glucose checks, once passes his swallow screen and he'll need a heart  healthy/carb controlled diet. He will need his metformin restarted that time. We will hold his Lantus for now unless his blood sugars began to rise significantly.   B12 deficiency - check B12 level and order supplementation.   HLD (hyperlipidemia) - checking fasting lipid panel as above, will need statin initiation as appropriate.  All the records are reviewed and case discussed with ED provider. Management plans discussed with the patient and/or family.  DVT PROPHYLAXIS: SubQ lovenox  ADMISSION STATUS: Inpatient  CODE STATUS: Full  TOTAL TIME TAKING CARE OF THIS PATIENT: 45 minutes.    WILLIS, DAVID FIELDING 05/13/2015, 10:19 PM  [facility]  Office  [phone]  CC: Primary care physician; Sherrie Mustache, MD

## 2015-05-13 NOTE — ED Provider Notes (Signed)
[facility] Emergency Department Provider Note   ____________________________________________  Time seen: 1915  I have reviewed the triage vital signs and the nursing notes.   HISTORY  Chief Complaint Aphasia   History limited by: Not Limited   HPI Roger [name] is a 49 y.o. male who presents to the emergency department today because of an episode where he had difficulty talking. He states he was at work when he felt he started developing difficulty typing, he thinks his right hand was worse, and then developed difficulty speaking. He states that he felt like she knew what words to say but was unable to get them out. He states that this lasts about 10 minutes and then it started getting better. At this time he denies any difficulty. He states that a couple of weeks ago he was seen for left leg weakness. He states since that time he has had carotid Dopplers as well as an echocardiogram which did not show any concerning findings.   Past Medical History  Diagnosis Date  . Hypertension   . Diabetes mellitus without complication     insulin-dependent  . Hyperlipidemia   . Obesity     Patient Active Problem List   Diagnosis Date Noted  . Tingling in extremities 04/25/2015  . Carpal tunnel syndrome 04/25/2015  . 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 B12 deficiency 04/25/2015    Past Surgical History  Procedure Laterality Date  . Eye surgery Right     Corneal implant    Current Outpatient Rx  Name  Route  Sig  Dispense  Refill  . albuterol (PROVENTIL HFA;VENTOLIN HFA) 108 (90 BASE) MCG/ACT inhaler   Inhalation   Inhale 2 puffs into the lungs.         Marland Kitchen aspirin 81 MG tablet   Oral   Take 81 mg by mouth daily.         . B-D UF III MINI PEN NEEDLES 31G X 5 MM MISC            0     Dispense as written.   . Exenatide ER (BYDUREON) 2 MG PEN   Subcutaneous   Inject 2 mg into the skin once a week. On sundays         . fexofenadine  (ALLEGRA) 180 MG tablet   Oral   Take 180 mg by mouth.         . furosemide (LASIX) 20 MG tablet   Oral   Take 1 tablet by mouth daily.      0   . glucose blood (BAYER CONTOUR NEXT TEST) test strip      use three times a day         . insulin glargine (LANTUS) 100 UNIT/ML injection   Subcutaneous   Inject 20-25 Units into the skin 2 (two) times daily. 25 units in the morning and 20 units at bedtime.         Marland Kitchen losartan (COZAAR) 100 MG tablet   Oral   Take 1 tablet by mouth daily.      0   . metFORMIN (GLUCOPHAGE) 500 MG tablet   Oral   Take 2,000 mg by mouth 2 (two) times daily.         . metoprolol succinate (TOPROL-XL) 50 MG 24 hr tablet   Oral   Take 1 tablet by mouth daily.      0   . niacin (NIASPAN) 500 MG CR tablet   Oral  Take 1,500 mg by mouth at bedtime.      0   . ONETOUCH DELICA LANCETS FINE MISC   Subcutaneous   Inject 1 Device into the skin.         Marland Kitchen simvastatin (ZOCOR) 20 MG tablet   Oral   Take 1 tablet by mouth at bedtime.      0   . topiramate (TOPAMAX) 50 MG tablet   Oral   Take 2 tablets (100 mg total) by mouth 2 (two) times daily.   60 tablet   1     Allergies Review of patient's allergies indicates no known allergies.  Family History  Problem Relation Age of Onset  . Diabetes Mother     Social History Social History  Substance Use Topics  . Smoking status: Never Smoker   . Smokeless tobacco: Not on file  . Alcohol Use: No    Review of Systems  Constitutional: Negative for fever. Cardiovascular: Negative for chest pain. Respiratory: Negative for shortness of breath. Gastrointestinal: Negative for abdominal pain, vomiting and diarrhea. Genitourinary: Negative for dysuria. Musculoskeletal: Negative for back pain. Skin: Negative for rash. Neurological: Positive for hand weakness, difficulty speaking  10-point ROS otherwise negative.  ____________________________________________   PHYSICAL  EXAM:  VITAL SIGNS: ED Triage Vitals  Enc Vitals Group     BP 05/13/15 1801 164/88 mmHg     Pulse Rate 05/13/15 1801 89     Resp 05/13/15 1801 18     Temp 05/13/15 1801 98.3 F (36.8 C)     Temp Source 05/13/15 1801 Oral     SpO2 05/13/15 1801 96 %     Weight 05/13/15 1801 224 lb (101.606 kg)     Height 05/13/15 1801 5\' 7"  (1.702 m)   Constitutional: Alert and oriented. Well appearing and in no distress. Eyes: Conjunctivae are normal. PERRL. Normal extraocular movements. ENT   Head: Normocephalic and atraumatic.   Nose: No congestion/rhinnorhea.   Mouth/Throat: Mucous membranes are moist.   Neck: No stridor. Hematological/Lymphatic/Immunilogical: No cervical lymphadenopathy. Cardiovascular: Normal rate, regular rhythm.  No murmurs, rubs, or gallops. Respiratory: Normal respiratory effort without tachypnea nor retractions. Breath sounds are clear and equal bilaterally. No wheezes/rales/rhonchi. Gastrointestinal: Soft and nontender. No distention.  Genitourinary: Deferred Musculoskeletal: Normal range of motion in all extremities. No joint effusions.  No lower extremity tenderness nor edema. Neurologic:  Normal speech and language. No gross focal neurologic deficits are appreciated. Speech is normal.  Skin:  Skin is warm, dry and intact. No rash noted. Psychiatric: Mood and affect are normal. Speech and behavior are normal. Patient exhibits appropriate insight and judgment.  ____________________________________________    LABS (pertinent positives/negatives)  Labs Reviewed  CBC - Abnormal; Notable for the following:    WBC 11.7 (*)    All other components within normal limits  DIFFERENTIAL - Abnormal; Notable for the following:    Neutro Abs 8.0 (*)    All other components within normal limits  COMPREHENSIVE METABOLIC PANEL - Abnormal; Notable for the following:    Glucose, Bld 122 (*)    All other components within normal limits  GLUCOSE, CAPILLARY -  Abnormal; Notable for the following:    Glucose-Capillary 131 (*)    All other components within normal limits  APTT  PROTIME-INR     ____________________________________________   EKG  I, Phineas Semen, attending physician, personally viewed and interpreted this EKG  EKG Time: 1914 Rate: 89 Rhythm: normal sinus rhythm Axis: normal Intervals: qtc 435 QRS: narrow,  q waves III, aVF ST changes: no st elevation Impression: abnormal EKG ____________________________________________    RADIOLOGY  CT head  IMPRESSION: No acute intracranial abnormality. No significant change.  ____________________________________________   PROCEDURES  Procedure(s) performed: None  Critical Care performed: No  ____________________________________________   INITIAL IMPRESSION / ASSESSMENT AND PLAN / ED COURSE  Pertinent labs & imaging results that were available during my care of the patient were reviewed by me and considered in my medical decision making (see chart for details).  Patient presented to the emergency department today with concern for an episode where he had difficulty with his speech. The patient now states he feels completely better. The story is hurting for a TIA. Patient has had some TIA workup done as an outpatient including carotid Dopplers and echo which she stated were normal. At this point he would still benefit from an MRI. I did discuss with Dr. Katrinka Blazing with neurology who recommended admission for imaging.   ____________________________________________   FINAL CLINICAL IMPRESSION(S) / ED DIAGNOSES  Final diagnoses:  Hillis Range, MD 05/13/15 2203

## 2015-05-14 ENCOUNTER — Inpatient Hospital Stay: Payer: Commercial Managed Care - PPO

## 2015-05-14 ENCOUNTER — Encounter: Payer: Self-pay | Admitting: Radiology

## 2015-05-14 LAB — GLUCOSE, CAPILLARY
GLUCOSE-CAPILLARY: 170 mg/dL — AB (ref 65–99)
GLUCOSE-CAPILLARY: 163 mg/dL — AB (ref 65–99)
GLUCOSE-CAPILLARY: 149 mg/dL — AB (ref 65–99)
Glucose-Capillary: 123 mg/dL — ABNORMAL HIGH (ref 65–99)
Glucose-Capillary: 126 mg/dL — ABNORMAL HIGH (ref 65–99)
Glucose-Capillary: 153 mg/dL — ABNORMAL HIGH (ref 65–99)

## 2015-05-14 LAB — TROPONIN I: Troponin I: <0.03 ng/mL (ref <0.031)

## 2015-05-14 LAB — BASIC METABOLIC PANEL
ANION GAP: 8 (ref 5–15)
BUN: 14 mg/dL (ref 6–20)
CALCIUM: 9.1 mg/dL (ref 8.9–10.3)
CO2: 28 mmol/L (ref 22–32)
CREATININE: 0.78 mg/dL (ref 0.61–1.24)
Chloride: 105 mmol/L (ref 101–111)
GFR calc Af Amer: >60 mL/min (ref >60)
GFR calc non Af Amer: >60 mL/min (ref >60)
GLUCOSE: 138 mg/dL — AB (ref 65–99)
Potassium: 3.8 mmol/L (ref 3.5–5.1)
Sodium: 141 mmol/L (ref 135–145)

## 2015-05-14 LAB — LIPID PANEL
Cholesterol: 140 mg/dL (ref 0–200)
HDL: 29 mg/dL — AB (ref >40)
LDL CALC: 67 mg/dL (ref 0–99)
TRIGLYCERIDES: 219 mg/dL — AB (ref <150)
Total CHOL/HDL Ratio: 4.8 RATIO
VLDL: 44 mg/dL — AB (ref 0–40)

## 2015-05-14 LAB — CBC
HCT: 43.8 % (ref 40.0–52.0)
HEMOGLOBIN: 14.8 g/dL (ref 13.0–18.0)
MCH: 33 pg (ref 26.0–34.0)
MCHC: 33.9 g/dL (ref 32.0–36.0)
MCV: 97.5 fL (ref 80.0–100.0)
Platelets: 264 10*3/uL (ref 150–440)
RBC: 4.49 MIL/uL (ref 4.40–5.90)
RDW: 13.5 % (ref 11.5–14.5)
WBC: 12 10*3/uL — ABNORMAL HIGH (ref 3.8–10.6)

## 2015-05-14 LAB — VITAMIN B12: VITAMIN B 12: 407 pg/mL (ref 180–914)

## 2015-05-14 LAB — HEMOGLOBIN A1C: Hgb A1c MFr Bld: 7.1 % — ABNORMAL HIGH (ref 4.0–6.0)

## 2015-05-14 MED ORDER — ASPIRIN 81 MG PO CHEW
81.0000 mg | CHEWABLE_TABLET | Freq: Every day | ORAL | Status: DC
Start: 2015-05-14 — End: 2015-05-16
  Administered 2015-05-14 – 2015-05-16 (×3): 81 mg via ORAL
  Filled 2015-05-14 (×3): qty 1

## 2015-05-14 MED ORDER — IOHEXOL 350 MG/ML SOLN
100.0000 mL | Freq: Once | INTRAVENOUS | Status: AC | PRN
  Administered 2015-05-14: 100 mL via INTRAVENOUS

## 2015-05-14 MED ORDER — HYDRALAZINE HCL 20 MG/ML IJ SOLN: 10.0000 mg | Freq: Four times a day (QID) | INTRAMUSCULAR | Status: DC | PRN

## 2015-05-14 MED ORDER — SODIUM CHLORIDE 0.9 % IV SOLN
INTRAVENOUS | Status: DC
  Administered 2015-05-14 – 2015-05-15 (×2): via INTRAVENOUS

## 2015-05-14 MED ORDER — CLOPIDOGREL BISULFATE 75 MG PO TABS
300.0000 mg | ORAL_TABLET | Freq: Once | ORAL | Status: AC
  Administered 2015-05-14: 17:00:00 300 mg via ORAL
  Filled 2015-05-14: qty 4

## 2015-05-14 MED ORDER — SIMVASTATIN 10 MG PO TABS
10.0000 mg | ORAL_TABLET | Freq: Every day | ORAL | Status: DC
  Administered 2015-05-14: 17:00:00 10 mg via ORAL
  Filled 2015-05-14: qty 1

## 2015-05-14 MED ORDER — CLOPIDOGREL BISULFATE 75 MG PO TABS
75.0000 mg | ORAL_TABLET | Freq: Every day | ORAL | Status: DC
  Administered 2015-05-15 – 2015-05-16 (×2): 75 mg via ORAL
  Filled 2015-05-14 (×2): qty 1

## 2015-05-14 MED ORDER — METFORMIN HCL 1000 MG PO TABS: 1000.0000 mg | ORAL_TABLET | Freq: Two times a day (BID) | ORAL | Status: AC

## 2015-05-14 NOTE — Plan of Care (Signed)
Problem: Discharge/Transitional Outcomes Goal: Other Discharge Outcomes/Goals Outcome: Progressing Plan of care progress to goal: No indicated barriers to progression noted. B/p elevated but patient is CVA. Diet: Pt passed swallow eval. Appropriate diet ordered. Neuro checks q 2 hrs along with VS. NSR on the monitor with heart rate in the 70's.  No c/o pain.  NIH scale currently zero.

## 2015-05-14 NOTE — Progress Notes (Signed)
[facility] [place] [facility]   PATIENT NAME: Roger [name]    MR#:  [id number]  DATE OF BIRTH:  1965-09-02  SUBJECTIVE:  CHIEF COMPLAINT:   Chief Complaint  Patient presents with  . Aphasia   No complaints. Aphasia has resolved.  REVIEW OF SYSTEMS:   Review of Systems  Constitutional: Negative for fever.  Respiratory: Negative for shortness of breath.   Cardiovascular: Negative for chest pain and palpitations.  Gastrointestinal: Negative for nausea, vomiting and abdominal pain.  Genitourinary: Negative for dysuria.  Neurological: Positive for speech change. Negative for dizziness, tingling, tremors, sensory change, focal weakness and loss of consciousness.  Psychiatric/Behavioral: Negative for depression.    DRUG ALLERGIES:  No Known Allergies  VITALS:  Blood pressure 159/92, pulse 82, temperature 98.6 F (37 C), temperature source Oral, resp. rate 18, height 5\' 7"  (1.702 m), weight 101.606 kg (224 lb), SpO2 98 %.  PHYSICAL EXAMINATION:  GENERAL:  49 y.o.-year-old patient sitting up with no acute distress.  EYES: Pupils equal, round, reactive to light and accommodation. No scleral icterus. Extraocular muscles intact.  HEENT: Head atraumatic, normocephalic. Oropharynx and nasopharynx clear.  NECK:  Supple, no jugular venous distention. No thyroid enlargement, no tenderness.  LUNGS: Normal breath sounds bilaterally, no wheezing, rales,rhonchi or crepitation. No use of accessory muscles of respiration.  CARDIOVASCULAR: S1, S2 normal. No murmurs, rubs, or gallops.  ABDOMEN: Soft, nontender, nondistended. Bowel sounds present. No organomegaly or mass.  EXTREMITIES: No pedal edema, cyanosis, or clubbing.  NEUROLOGIC: Cranial nerves II through XII are intact. Muscle strength 5/5 in all extremities. Sensation intact. Gait not checked.  PSYCHIATRIC: The patient is alert and oriented x 3.  SKIN: No obvious rash, lesion, or ulcer.    LABORATORY  PANEL:   CBC  Recent Labs Lab 05/14/15 0025  WBC 12.0*  HGB 14.8  HCT 43.8  PLT 264   ------------------------------------------------------------------------------------------------------------------  Chemistries   Recent Labs Lab 05/13/15 1807 05/14/15 0025  NA 140 141  K 3.7 3.8  CL 103 105  CO2 27 28  GLUCOSE 122* 138*  BUN 14 14  CREATININE 0.82 0.78  CALCIUM 9.5 9.1  AST 24  --   ALT 18  --   ALKPHOS 60  --   BILITOT 0.9  --    ------------------------------------------------------------------------------------------------------------------  Cardiac Enzymes  Recent Labs Lab 05/14/15 0025  TROPONINI <0.03   ------------------------------------------------------------------------------------------------------------------  RADIOLOGY:  Ct Head Wo Contrast  05/13/2015   CLINICAL DATA:  Aphasia  EXAM: CT HEAD WITHOUT CONTRAST  TECHNIQUE: Contiguous axial images were obtained from the base of the skull through the vertex without intravenous contrast.  COMPARISON:  04/05/2015  FINDINGS: No skull fracture is noted. Paranasal sinuses and mastoid air cells are unremarkable.  No intracranial hemorrhage, mass effect or midline shift.  No acute cortical infarction. No mass lesion is noted in this unenhanced scan. The gray and white-matter differentiation is preserved. No intra or extra-axial fluid collection.  IMPRESSION: No acute intracranial abnormality.  No significant change.   Electronically Signed   By: Natasha Mead M.D.   On: 05/13/2015 18:27    EKG:   Orders placed or performed during the hospital encounter of 05/13/15  . ED EKG  . ED EKG    ASSESSMENT AND PLAN:   1) TIA/Stroke: - CT negative, MRI pending -  Carotids and ECHO performed outpatient and normal - cbg's OK, A1C pendign lipids OK - symptoms resolved, no dysphagia, tolerating diet - neurology consultation pending -  continue B12, start ASA 81 - did not check cbg at time of symptoms, concerning  for hypoglycemic episode  2) HTN - controlled, holding home meds in setting of ? CVA  3) DM 2 - A1C pending - cbg's controlled on SSI here, holding lantus which he takes 45 u daily  4) OSA - continue CPAP  5) B12 deficiency - continue replacement  CODE STATUS: full  TOTAL TIME TAKING CARE OF THIS PATIENT: 30 minutes.  Greater than 50% of time spent in care coordination and counseling. POSSIBLE D/C [place] today depending on test and consult results.   Elby Showers M.D on 05/14/2015 at 12:53 PM  Between 7am to 6pm - Pager - [phone]  After 6pm go to [email or web address] - password [email or web address] [facility]  [facility]  Office  [phone]  CC: Primary care physician; Sherrie Mustache, MD

## 2015-05-14 NOTE — Progress Notes (Signed)
CTA shows 90% stenosis but no occlusion.  Needs no intervention for this at this time but dual antiplatelet should be sufficient.  Will still need TEE to look for cardiac causes.  Permissive HTN and do not treat unless > 220/120.  Please call with questions.

## 2015-05-14 NOTE — Evaluation (Signed)
Physical Therapy Evaluation Patient Details Name: Deloris Mittag MRN: [id number] DOB: [birth year] Today's Date: 05/14/2015   History of Present Illness  Pt had a brief episode of expressive aphasia yesterday, feeling back to normal.  Clinical Impression  Pt is feeling essentially back to his baseline.  He is able to ambulate w/o issue, with good speed and safety.  He negotiates up/down a flight of steps safely and w/o fatigue and generally has no PT needs at this time.  Pt reports that he has been feeling better since starting B12 treatments.    Follow Up Recommendations No PT follow up    Equipment Recommendations       Recommendations for Other Services       Precautions / Restrictions Precautions Precautions: None Restrictions Weight Bearing Restrictions: No      Mobility  Bed Mobility Overal bed mobility: Independent                Transfers Overall transfer level: Independent                  Ambulation/Gait Ambulation/Gait assistance: Independent Ambulation Distance (Feet): 250 Feet Assistive device: None       General Gait Details: Pt walks with confident, community appropriate speed.  He has no LOBs and reports being at his baseline.   Stairs Stairs: Yes Stairs assistance: Independent Stair Management: One rail Right Number of Stairs: 12 General stair comments: Pt goes up/down steps with no safety issues, good confidence  Wheelchair Mobility    Modified Rankin (Stroke Patients Only)       Balance Overall balance assessment: Independent                                           Pertinent Vitals/Pain Pain Assessment: No/denies pain    Home Living Family/patient expects to be discharged to:: Private residence Living Arrangements: Parent   Type of Home: House Home Access: Stairs to enter     Home Layout: Two level        Prior Function Level of Independence: Independent               Hand  Dominance        Extremity/Trunk Assessment   Upper Extremity Assessment: Overall WFL for tasks assessed           Lower Extremity Assessment: Overall WFL for tasks assessed         Communication   Communication: No difficulties  Cognition Arousal/Alertness: Awake/alert Behavior During Therapy: WFL for tasks assessed/performed Overall Cognitive Status: Within Functional Limits for tasks assessed                      General Comments      Exercises        Assessment/Plan    PT Assessment    PT Diagnosis Difficulty walking   PT Problem List    PT Treatment Interventions     PT Goals (Current goals can be found in the Care Plan section) Acute Rehab PT Goals Patient Stated Goal: Just want to get back home    Frequency     Barriers to discharge        Co-evaluation               End of Session Equipment Utilized During Treatment: Gait belt Activity Tolerance: Patient tolerated treatment well Patient left:  in bed           Time: 0743-0756 PT Time Calculation (min) (ACUTE ONLY): 13 min   Charges:   PT Evaluation $Initial PT Evaluation Tier I: 1 Procedure     PT G Codes:       Loran Senters, PT, DPT [phone]  Malachi Pro 05/14/2015, 10:06 AM

## 2015-05-14 NOTE — Progress Notes (Signed)
SLP Cancellation Note  Patient Details Name: Roger [name] MRN: [id number] DOB: 1966/02/27   Cancelled treatment:       Reason Eval/Treat Not Completed: Patient at procedure or test/unavailable Pt is currently out of his room for an MRI. Reviewed chart and spoke w/nsg. No reports of any difficulty w/swallowing or speech at this time. Per review pt's symptoms have resolved since admission and he is currently on a regular diet. Will re-attempt evaluation as pt is available and as indicated. Nsg in agreement.    Daviston,Maaran 05/14/2015, 12:38 PM

## 2015-05-14 NOTE — Evaluation (Signed)
Occupational Therapy Evaluation Patient Details Name: Dalvin Clipper MRN: [id number] DOB: [birth year] Today's Date: 05/14/2015    History of Present Illness Patient reports he had an episode of expressive aphasia yesterday, some numbness and tingling in both feet which still persists today. Felt like he had a foggy mind, trouble typing yesterday that lasted for 15 minutes.   Clinical Impression   Patient is a 49 yo male who was admitted yesterday after having an episode of expressive aphasia, difficulty typing, had a "foggy mind".  He does report difficulty walking about 2 weeks ago and was found to have low B 12 levels and was receiving medical treatment.  He lives with his mom in a 2 story home with bedroom on the 2nd level.  No steps to enter but about 15 to the 2nd floor.  He has a walk in shower and a regular toilet with no adaptive equipment.  He was previously independent and worked full time.   He demonstrates no current impairments in strength, ROM, coordination or sensation in BUEs.  He is able to demonstrate ADL tasks independently.  No further OT needs indicated at this time.     Follow Up Recommendations       Equipment Recommendations       Recommendations for Other Services       Precautions / Restrictions Precautions Precautions: None Restrictions Weight Bearing Restrictions: No      Mobility Bed Mobility Overal bed mobility: Independent                Transfers Overall transfer level: Independent                    Balance Overall balance assessment: Independent                                          ADL Overall ADL's : Independent                                             Vision     Perception     Praxis      Pertinent Vitals/Pain Pain Assessment: No/denies pain     Hand Dominance Right   Extremity/Trunk Assessment Upper Extremity Assessment Upper Extremity Assessment: Overall WFL for  tasks assessed (Grip strength  R 91#, L 80#.  Lateral pinch R 18#, L 18#, )   Lower Extremity Assessment Lower Extremity Assessment: Overall WFL for tasks assessed       Communication Communication Communication: No difficulties   Cognition Arousal/Alertness: Awake/alert Behavior During Therapy: WFL for tasks assessed/performed Overall Cognitive Status: Within Functional Limits for tasks assessed                     General Comments       Exercises   Other Exercises Other Exercises: Strength BUE 5/5 overall, ROM WNLs, 9 hole peg test Right 21 secs, Left 25 secs, finger to nose intact, stereognosis intact   Shoulder Instructions      Home Living Family/patient expects to be discharged to:: Private residence Living Arrangements: Alone Available Help at Discharge: Family Type of Home: House Home Access: Stairs to enter     Home Layout: Two level     Bathroom Shower/Tub: Psychologist, counselling  Bathroom Toilet: Pharmacist, community: Yes   Home Equipment: None          Prior Functioning/Environment Level of Independence: Independent             OT Diagnosis:     OT Problem List:     OT Treatment/Interventions:      OT Goals(Current goals can be found in the care plan section) Acute Rehab OT Goals Patient Stated Goal: Wants to go home and be able to do as much as possible. OT Goal Formulation: With patient  OT Frequency:     Barriers to D/C:            Co-evaluation              End of Session    Activity Tolerance:   Patient left:     Time: 4098-1191 OT Time Calculation (min): 25 min Charges:  OT General Charges $OT Visit: 1 Procedure OT Evaluation $Initial OT Evaluation Tier I: 1 Procedure G-Codes:    Lovett,Amy 26-May-2015, 10:16 AM

## 2015-05-14 NOTE — Plan of Care (Signed)
Problem: Discharge/Transitional Outcomes Goal: Other Discharge Outcomes/Goals Outcome: Progressing BP elevated, MD aware, hydralazine ordered. Rest of VSS. Pt denies pain. NIH score 0. No neurological changes during the shift. Ambulates independently. MRI positive for a stroke, plavix initiated. Family at the bedside.

## 2015-05-14 NOTE — Consult Note (Signed)
Reason for Consult: stroke Referring Physician: Dr. Lurline Hare is an 49 y.o. male.  HPI: seen at request of Dr. Volanda Napoleon for stroke;  49 yo RHD M presents to [facility] secondary to sudden onset of difficulty speaking.  Pt reports that he was completely alert but could not say what he wanted nor could he type at work.  This lasted about 40 minutes.  Pt denies any focal weakness or numbness.  He does report that one month ago he was seen for L sided numbness which improved but was thought to be due to low B12.  He denies headache.  Past Medical History  Diagnosis Date  . Hypertension   . Diabetes mellitus without complication     insulin-dependent  . Hyperlipidemia   . Obesity   . OSA on CPAP   . RAD (reactive airway disease)   . B12 deficiency     Past Surgical History  Procedure Laterality Date  . Eye surgery Right     Corneal implant    Family History  Problem Relation Age of Onset  . Diabetes Mother     Social History:  reports that he has never smoked. He does not have any smokeless tobacco history on file. He reports that he does not drink alcohol or use illicit drugs.  Allergies: No Known Allergies  Medications: personally reviewed by me as per chart  Results for orders placed or performed during the hospital encounter of 05/13/15 (from the past 48 hour(s))  Glucose, capillary     Status: Abnormal   Collection Time: 05/13/15  6:06 PM  Result Value Ref Range   Glucose-Capillary 131 (H) 65 - 99 mg/dL  APTT     Status: None   Collection Time: 05/13/15  6:07 PM  Result Value Ref Range   aPTT 26 24 - 36 seconds  CBC     Status: Abnormal   Collection Time: 05/13/15  6:07 PM  Result Value Ref Range   WBC 11.7 (H) 3.8 - 10.6 K/uL   RBC 4.60 4.40 - 5.90 MIL/uL   Hemoglobin 15.2 13.0 - 18.0 g/dL   HCT 44.3 40.0 - 52.0 %   MCV 96.4 80.0 - 100.0 fL   MCH 33.1 26.0 - 34.0 pg   MCHC 34.4 32.0 - 36.0 g/dL   RDW 13.5 11.5 - 14.5 %   Platelets 278 150 - 440 K/uL   Differential     Status: Abnormal   Collection Time: 05/13/15  6:07 PM  Result Value Ref Range   Neutrophils Relative % 69 %   Neutro Abs 8.0 (H) 1.4 - 6.5 K/uL   Lymphocytes Relative 21 %   Lymphs Abs 2.5 1.0 - 3.6 K/uL   Monocytes Relative 9 %   Monocytes Absolute 1.0 0.2 - 1.0 K/uL   Eosinophils Relative 1 %   Eosinophils Absolute 0.2 0 - 0.7 K/uL   Basophils Relative 0 %   Basophils Absolute 0.1 0 - 0.1 K/uL  Comprehensive metabolic panel     Status: Abnormal   Collection Time: 05/13/15  6:07 PM  Result Value Ref Range   Sodium 140 135 - 145 mmol/L   Potassium 3.7 3.5 - 5.1 mmol/L   Chloride 103 101 - 111 mmol/L   CO2 27 22 - 32 mmol/L   Glucose, Bld 122 (H) 65 - 99 mg/dL   BUN 14 6 - 20 mg/dL   Creatinine, Ser 0.82 0.61 - 1.24 mg/dL   Calcium 9.5 8.9 - 10.3 mg/dL  Total Protein 7.8 6.5 - 8.1 g/dL   Albumin 4.3 3.5 - 5.0 g/dL   AST 24 15 - 41 U/L   ALT 18 17 - 63 U/L   Alkaline Phosphatase 60 38 - 126 U/L   Total Bilirubin 0.9 0.3 - 1.2 mg/dL   GFR calc non Af Amer >60 >60 mL/min   GFR calc Af Amer >60 >60 mL/min    Comment: (NOTE) The eGFR has been calculated using the CKD EPI equation. This calculation has not been validated in all clinical situations. eGFR's persistently <60 mL/min signify possible Chronic Kidney Disease.    Anion gap 10 5 - 15  Protime-INR     Status: None   Collection Time: 05/13/15  6:07 PM  Result Value Ref Range   Prothrombin Time 11.9 11.4 - 15.0 seconds   INR 0.86   Troponin I     Status: None   Collection Time: 05/14/15 12:25 AM  Result Value Ref Range   Troponin I <0.03 <0.031 ng/mL    Comment:        NO INDICATION OF MYOCARDIAL INJURY.   Lipid panel     Status: Abnormal   Collection Time: 05/14/15 12:25 AM  Result Value Ref Range   Cholesterol 140 0 - 200 mg/dL   Triglycerides 219 (H) <150 mg/dL   HDL 29 (L) >40 mg/dL   Total CHOL/HDL Ratio 4.8 RATIO   VLDL 44 (H) 0 - 40 mg/dL   LDL Cholesterol 67 0 - 99 mg/dL     Comment:        Total Cholesterol/HDL:CHD Risk Coronary Heart Disease Risk Table                     Men   Women  1/2 Average Risk   3.4   3.3  Average Risk       5.0   4.4  2 X Average Risk   9.6   7.1  3 X Average Risk  23.4   11.0        Use the calculated Patient Ratio above and the CHD Risk Table to determine the patient's CHD Risk.        ATP III CLASSIFICATION (LDL):  <100     mg/dL   Optimal  100-129  mg/dL   Near or Above                    Optimal  130-159  mg/dL   Borderline  160-189  mg/dL   High  >190     mg/dL   Very High   Hemoglobin A1c     Status: Abnormal   Collection Time: 05/14/15 12:25 AM  Result Value Ref Range   Hgb A1c MFr Bld 7.1 (H) 4.0 - 6.0 %  Basic metabolic panel     Status: Abnormal   Collection Time: 05/14/15 12:25 AM  Result Value Ref Range   Sodium 141 135 - 145 mmol/L   Potassium 3.8 3.5 - 5.1 mmol/L    Comment: HEMOLYSIS AT THIS LEVEL MAY AFFECT RESULT   Chloride 105 101 - 111 mmol/L   CO2 28 22 - 32 mmol/L   Glucose, Bld 138 (H) 65 - 99 mg/dL   BUN 14 6 - 20 mg/dL   Creatinine, Ser 0.78 0.61 - 1.24 mg/dL   Calcium 9.1 8.9 - 10.3 mg/dL   GFR calc non Af Amer >60 >60 mL/min   GFR calc Af Amer >60 >60 mL/min  Comment: (NOTE) The eGFR has been calculated using the CKD EPI equation. This calculation has not been validated in all clinical situations. eGFR's persistently <60 mL/min signify possible Chronic Kidney Disease.    Anion gap 8 5 - 15  CBC     Status: Abnormal   Collection Time: 05/14/15 12:25 AM  Result Value Ref Range   WBC 12.0 (H) 3.8 - 10.6 K/uL   RBC 4.49 4.40 - 5.90 MIL/uL   Hemoglobin 14.8 13.0 - 18.0 g/dL   HCT 43.8 40.0 - 52.0 %   MCV 97.5 80.0 - 100.0 fL   MCH 33.0 26.0 - 34.0 pg   MCHC 33.9 32.0 - 36.0 g/dL   RDW 13.5 11.5 - 14.5 %   Platelets 264 150 - 440 K/uL  Glucose, capillary     Status: Abnormal   Collection Time: 05/14/15 12:49 AM  Result Value Ref Range   Glucose-Capillary 126 (H) 65 - 99 mg/dL    Comment 1 Notify RN   Glucose, capillary     Status: Abnormal   Collection Time: 05/14/15  8:03 AM  Result Value Ref Range   Glucose-Capillary 153 (H) 65 - 99 mg/dL  Glucose, capillary     Status: Abnormal   Collection Time: 05/14/15 11:16 AM  Result Value Ref Range   Glucose-Capillary 170 (H) 65 - 99 mg/dL  Glucose, capillary     Status: Abnormal   Collection Time: 05/14/15  2:03 PM  Result Value Ref Range   Glucose-Capillary 123 (H) 65 - 99 mg/dL    Ct Head Wo Contrast  05/13/2015   CLINICAL DATA:  Aphasia  EXAM: CT HEAD WITHOUT CONTRAST  TECHNIQUE: Contiguous axial images were obtained from the base of the skull through the vertex without intravenous contrast.  COMPARISON:  04/05/2015  FINDINGS: No skull fracture is noted. Paranasal sinuses and mastoid air cells are unremarkable.  No intracranial hemorrhage, mass effect or midline shift.  No acute cortical infarction. No mass lesion is noted in this unenhanced scan. The gray and white-matter differentiation is preserved. No intra or extra-axial fluid collection.  IMPRESSION: No acute intracranial abnormality.  No significant change.   Electronically Signed   By: Lahoma Crocker M.D.   On: 05/13/2015 18:27   Mr Brain Wo Contrast  05/14/2015   CLINICAL DATA:  TIA. Mental status changes. Speech disturbance. Acute presentation yesterday.  EXAM: MRI HEAD WITHOUT CONTRAST  MRA HEAD WITHOUT CONTRAST  TECHNIQUE: Multiplanar, multiecho pulse sequences of the brain and surrounding structures were obtained without intravenous contrast. Angiographic images of the head were obtained using MRA technique without contrast.  COMPARISON:  CT 05/13/2015.  FINDINGS: MRI HEAD FINDINGS  Diffusion imaging shows patchy areas of acute infarction in the right parietal deep and subcortical white matter. No cortical infarction. In the left hemisphere, there is a punctate focus of acute infarction in the left posterior frontal subcortical white matter. No other area of acute  insult.  The brainstem and cerebellum are normal. Elsewhere in the cerebral hemispheres, there are a few areas of old small vessel infarction within the white matter. No mass lesion, hemorrhage, hydrocephalus or extra-axial collection. No pituitary mass. No inflammatory sinus disease. No skull or skullbase lesion.  MRA HEAD FINDINGS  Both internal carotid arteries are widely patent into the brain. No siphon stenosis. On the right, the anterior and middle cerebral arteries are patent. No proximal stenosis. More distal branch vessels show some areas of narrowing and irregularity. On the left, the anterior and middle  cerebral arteries are patent proximally. There is severe stenosis of the left M1 segment. More distal branch vessels do show either reconstituted flow or reduced antegrade flow.  The right vertebral artery is a large vessel widely patent to the basilar. No antegrade flow is present in the left vertebral artery. There is retrograde flow in the distal left vertebral artery to the anterior inferior cerebellar artery or Pike up. The basilar artery shows atherosclerotic disease with serial stenoses, the most severe measuring 50%. Superior cerebellar and posterior cerebral artery vessels are patent, but the distal branch vessels show pronounced atherosclerotic irregularity.  IMPRESSION: 2-3 cm region of acute infarction within the right parietal deep and subcortical white matter. Punctate focus of subcortical white matter infarction in the left posterior frontal lobe. No evidence of hemorrhage or mass effect.  Chronic small vessel ischemic changes elsewhere within the cerebral hemispheric white matter.  Diffuse intracranial atherosclerotic disease of the more distal branch vessels.  Severe stenosis of the left M1 segment placing the patient at risk of a large vessel territory MCA infarction. Consider neuro interventional treatment of this stenosis.  Occluded left vertebral artery.  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   Electronically Signed   By: Nelson Chimes M.D.   On: 05/14/2015 13:34   Mr Lovenia Kim  05/14/2015   CLINICAL DATA:  TIA. Mental status changes. Speech disturbance. Acute presentation yesterday.  EXAM: MRI HEAD WITHOUT CONTRAST  MRA HEAD WITHOUT CONTRAST  TECHNIQUE: Multiplanar, multiecho pulse sequences of the brain and surrounding structures were obtained without intravenous contrast. Angiographic images of the head were obtained using MRA technique without contrast.  COMPARISON:  CT 05/13/2015.  FINDINGS: MRI HEAD FINDINGS  Diffusion imaging shows patchy areas of acute infarction in the right parietal deep and subcortical white matter. No cortical infarction. In the left hemisphere, there is a punctate focus of acute infarction in the left posterior frontal subcortical white matter. No other area of acute insult.  The brainstem and cerebellum are normal. Elsewhere in the cerebral hemispheres, there are a few areas of old small vessel infarction within the white matter. No mass lesion, hemorrhage, hydrocephalus or extra-axial collection. No pituitary mass. No inflammatory sinus disease. No skull or skullbase lesion.  MRA HEAD FINDINGS  Both internal carotid arteries are widely patent into the brain. No siphon stenosis. On the right, the anterior and middle cerebral arteries are patent. No proximal stenosis. More distal branch vessels show some areas of narrowing and irregularity. On the left, the anterior and middle cerebral arteries are patent proximally. There is severe stenosis of the left M1 segment. More distal branch vessels do show either reconstituted flow or reduced antegrade flow.  The right vertebral artery is a large vessel widely patent to the basilar. No antegrade flow is present in the left vertebral artery. There is retrograde flow in the distal left vertebral artery to the anterior inferior cerebellar artery or Pike up. The basilar artery shows  atherosclerotic disease with serial stenoses, the most severe measuring 50%. Superior cerebellar and posterior cerebral artery vessels are patent, but the distal branch vessels show pronounced atherosclerotic irregularity.  IMPRESSION: 2-3 cm region of acute infarction within the right parietal deep and subcortical white matter. Punctate focus of subcortical white matter infarction in the left posterior frontal lobe. No evidence of hemorrhage or mass effect.  Chronic small vessel ischemic changes elsewhere within the cerebral hemispheric white matter.  Diffuse intracranial atherosclerotic disease of the more distal  branch vessels.  Severe stenosis of the left M1 segment placing the patient at risk of a large vessel territory MCA infarction. Consider neuro interventional treatment of this stenosis.  Occluded left vertebral artery.  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   Electronically Signed   By: Nelson Chimes M.D.   On: 05/14/2015 13:34    Review of Systems  Constitutional: Negative.   HENT: Negative.   Eyes: Negative.   Respiratory: Negative.   Cardiovascular: Negative.   Gastrointestinal: Negative.   Genitourinary: Negative.   Musculoskeletal: Negative.   Skin: Negative.   Neurological: Positive for speech change and focal weakness. Negative for dizziness, tingling, tremors, sensory change, seizures and loss of consciousness.  Psychiatric/Behavioral: Negative.    Blood pressure 162/98, pulse 84, temperature 98.6 F (37 C), temperature source Oral, resp. rate 19, height _0  (1.702 m), weight 101.606 kg (224 lb), SpO2 96 %. Physical Exam  Nursing note and vitals reviewed. Constitutional: He appears well-developed and well-nourished. No distress.  HENT:  Head: Normocephalic and atraumatic.  Right Ear: External ear normal.  Left Ear: External ear normal.  Nose: Nose normal.  Mouth/Throat: Oropharynx is clear and moist.  Eyes: Conjunctivae and EOM are  normal. Pupils are equal, round, and reactive to light.  Neck: Normal range of motion. Neck supple. No JVD present.  Cardiovascular: Normal rate, regular rhythm, normal heart sounds and intact distal pulses.   Respiratory: Effort normal and breath sounds normal. No respiratory distress.  GI: Soft. Bowel sounds are normal. He exhibits no distension.  Musculoskeletal: Normal range of motion. He exhibits no edema.  Neurological:  A+Ox3, nl speech and language PERRLA, EOMI, nl VF, face symmetric, tongue midline 5/5 R, drift on L FTN and HTS  WNL 1+/4 B, mute plantars B Intact temp and pin, no neglect  NIHSS:  2  Skin: Skin is warm and dry. He is not diaphoretic.  Psychiatric: He has a normal mood and affect.   MRI of brain personally reviewed by me and shows subacute R MCA infarct with L M1 occlusion  Assessment/Plan: 1.  L M1 occlusion-  This is likely the cause of current symptoms and pt can worsen if there is still an occlusion.  Right now he is asymptomatic from this but there is a large potential worsening. 2.  R MCA subacute infarct-  This is mildly symptomatic with some L sided weakness -  CTA of head and neck now;  If pt has occlusion of L M1 still he will need to be transferred to a place with neuro-intervention -  If pt worsens then he needs to be transferred to institution with neuro-intervention -  Load plavix 350m PO now then 736mdaily -  Start Zocor 2018mHS -  Continue ASA 52m17mily -  Echo is pending and will need TEE if negative -  Will likely send hypercoaguable w/u tomorrow -  Permissive HTN -  Will follow closely  [id number]/2016, 3:15 PM

## 2015-05-15 LAB — CBC
HCT: 43 % (ref 40.0–52.0)
HEMOGLOBIN: 14.5 g/dL (ref 13.0–18.0)
MCH: 32.5 pg (ref 26.0–34.0)
MCHC: 33.8 g/dL (ref 32.0–36.0)
MCV: 96 fL (ref 80.0–100.0)
Platelets: 253 10*3/uL (ref 150–440)
RBC: 4.48 MIL/uL (ref 4.40–5.90)
RDW: 13.4 % (ref 11.5–14.5)
WBC: 10 10*3/uL (ref 3.8–10.6)

## 2015-05-15 LAB — GLUCOSE, CAPILLARY
GLUCOSE-CAPILLARY: 152 mg/dL — AB (ref 65–99)
GLUCOSE-CAPILLARY: 144 mg/dL — AB (ref 65–99)
GLUCOSE-CAPILLARY: 146 mg/dL — AB (ref 65–99)
GLUCOSE-CAPILLARY: 229 mg/dL — AB (ref 65–99)

## 2015-05-15 LAB — BASIC METABOLIC PANEL
Anion gap: 7 (ref 5–15)
BUN: 10 mg/dL (ref 6–20)
CALCIUM: 8.7 mg/dL — AB (ref 8.9–10.3)
CHLORIDE: 109 mmol/L (ref 101–111)
CO2: 26 mmol/L (ref 22–32)
CREATININE: 0.7 mg/dL (ref 0.61–1.24)
Glucose, Bld: 168 mg/dL — ABNORMAL HIGH (ref 65–99)
Potassium: 3.8 mmol/L (ref 3.5–5.1)
SODIUM: 142 mmol/L (ref 135–145)

## 2015-05-15 LAB — ANTITHROMBIN III: AntiThromb III Func: 111 % (ref 75–120)

## 2015-05-15 MED ORDER — HYDRALAZINE HCL 20 MG/ML IJ SOLN: 10.0000 mg | Freq: Four times a day (QID) | INTRAMUSCULAR | Status: DC | PRN

## 2015-05-15 MED ORDER — SIMVASTATIN 20 MG PO TABS
20.0000 mg | ORAL_TABLET | Freq: Every day | ORAL | Status: DC
  Administered 2015-05-15: 20 mg via ORAL
  Filled 2015-05-15: qty 1

## 2015-05-15 NOTE — Progress Notes (Signed)
[facility] [place] [facility]   PATIENT NAME: Roger [name]    MR#:  [id number]  DATE OF BIRTH:  07-30-1966  SUBJECTIVE:  CHIEF COMPLAINT:   Chief Complaint  Patient presents with  . Aphasia   No complaints. Aphasia has resolved, no recurrence. No new complaints.  REVIEW OF SYSTEMS:   Review of Systems  Constitutional: Negative for fever.  Respiratory: Negative for shortness of breath.   Cardiovascular: Negative for chest pain and palpitations.  Gastrointestinal: Negative for nausea, vomiting and abdominal pain.  Genitourinary: Negative for dysuria.  Neurological: Positive for speech change. Negative for dizziness, tingling, tremors, sensory change, focal weakness and loss of consciousness.  Psychiatric/Behavioral: Negative for depression.    DRUG ALLERGIES:  No Known Allergies  VITALS:  Blood pressure 149/93, pulse 92, temperature 98.1 F (36.7 C), temperature source Oral, resp. rate 20, height  (1.702 m), weight 101.606 kg (224 lb), SpO2 97 %.  PHYSICAL EXAMINATION:  GENERAL:  49 y.o.-year-old patient sitting up with no acute distress.  EYES: Pupils equal, round, reactive to light and accommodation. No scleral icterus. Extraocular muscles intact.  HEENT: Head atraumatic, normocephalic. Oropharynx and nasopharynx clear.  NECK:  Supple, no jugular venous distention. No thyroid enlargement, no tenderness.  LUNGS: Normal breath sounds bilaterally, no wheezing, rales,rhonchi or crepitation. No use of accessory muscles of respiration.  CARDIOVASCULAR: S1, S2 normal. No murmurs, rubs, or gallops.  ABDOMEN: Soft, nontender, nondistended. Bowel sounds present. No organomegaly or mass.  EXTREMITIES: No pedal edema, cyanosis, or clubbing.  NEUROLOGIC: Cranial nerves II through XII are intact. Muscle strength 5/5 in all extremities. Sensation intact. Gait not checked.  PSYCHIATRIC: The patient is alert and oriented x 3.  SKIN: No obvious rash,  lesion, or ulcer.    LABORATORY PANEL:   CBC  Recent Labs Lab 05/15/15 0727  WBC 10.0  HGB 14.5  HCT 43.0  PLT 253   ------------------------------------------------------------------------------------------------------------------  Chemistries   Recent Labs Lab 05/13/15 1807  05/15/15 0727  NA 140  < > 142  K 3.7  < > 3.8  CL 103  < > 109  CO2 27  < > 26  GLUCOSE 122*  < > 168*  BUN 14  < > 10  CREATININE 0.82  < > 0.70  CALCIUM 9.5  < > 8.7*  AST 24  --   --   ALT 18  --   --   ALKPHOS 60  --   --   BILITOT 0.9  --   --   < > = values in this interval not displayed. ------------------------------------------------------------------------------------------------------------------  Cardiac Enzymes  Recent Labs Lab 05/14/15 0025  TROPONINI <0.03   ------------------------------------------------------------------------------------------------------------------  RADIOLOGY:  Ct Angio Head W/cm &/or Wo Cm  05/14/2015   CLINICAL DATA:  Sudden onset of difficulty speaking today. Acute infarction in the right parietal white matter and to a lesser extent in the left frontal white matter.  EXAM: CT ANGIOGRAPHY HEAD AND NECK  TECHNIQUE: Multidetector CT imaging of the head and neck was performed using the standard protocol during bolus administration of intravenous contrast. Multiplanar CT image reconstructions and MIPs were obtained to evaluate the vascular anatomy. Carotid stenosis measurements (when applicable) are obtained utilizing NASCET criteria, using the distal internal carotid diameter as the denominator.  CONTRAST:  OMNIPAQUE IOHEXOL 350 MG/ML SOLN  COMPARISON:  MRI studies earlier same day  FINDINGS: CTA NECK  Aortic arch: Normal appearance of the arch. No stenosis, aneurysm or dissection.  Branching pattern of the brachiocephalic vessels is normal.  Right carotid system: Wide patency of the common carotid artery to the bifurcation. Normal appearing  bifurcation. Normal appearing cervical internal carotid artery.  Left carotid system: Wide patency of the common carotid artery to the bifurcation. Normal appearing bifurcation. Normal appearing cervical internal carotid artery.  Vertebral arteries:The right vertebral artery is dominant. Both vertebral artery origins are patent. New the right vertebral artery is patent through the cervical region. The left vertebral artery is a very small vessel. I think there may be stenosis in the proximal few cm, but there does appear to be some flow in the vessel distally.  Skeleton: Ordinary spondylosis C6-7.  No acute finding.  Other neck: No significant soft tissue lesion. Lung apices are clear.  CTA HEAD  Anterior circulation: Both internal carotid arteries are widely patent through the skullbase and carotid siphon regions. On the right, the anterior and middle cerebral vessels are patent without proximal stenosis. More distal MCA branch vessels do show some atherosclerotic irregularity. On the left, there is severe stenosis of the M1 segment on the left, 90% or greater. The patient would be at risk of large vessel left MCA infarction. No anterior cerebral stenosis on the left.  Posterior circulation: The right vertebral artery is widely patent at the foramen magnum and is patent to the basilar. The left vertebral artery does show some flow at the foramen magnum level, but appears to be occluded distally. There is probably retrograde flow back to PICA. There is atherosclerotic disease throughout the basilar artery, with the most severe stenosis measured at 50% in the midportion. Superior cerebellar and posterior cerebral arteries are patent, but showed distal narrowing and irregularity. There are patent posterior communicating arteries bilaterally.  Venous sinuses: Patent and normal  Anatomic variants: None significant  Delayed phase: No abnormal enhancement  IMPRESSION: Findings consistent with previous MR angiogram.  No  carotid bifurcation disease on either side.  Advanced intracranial atherosclerosis. Severe stenosis of the M1 segment on the left, 90% or greater. The patient would be at risk of large vessel left MCA territory infarction.  Distal left vertebral occlusion.  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  Distal intracranial vessel narrowing and irregularity.   Electronically Signed   By: Paulina Fusi M.D.   On: 05/14/2015 16:36   Ct Head Wo Contrast  05/13/2015   CLINICAL DATA:  Aphasia  EXAM: CT HEAD WITHOUT CONTRAST  TECHNIQUE: Contiguous axial images were obtained from the base of the skull through the vertex without intravenous contrast.  COMPARISON:  04/05/2015  FINDINGS: No skull fracture is noted. Paranasal sinuses and mastoid air cells are unremarkable.  No intracranial hemorrhage, mass effect or midline shift.  No acute cortical infarction. No mass lesion is noted in this unenhanced scan. The gray and white-matter differentiation is preserved. No intra or extra-axial fluid collection.  IMPRESSION: No acute intracranial abnormality.  No significant change.   Electronically Signed   By: Natasha Mead M.D.   On: 05/13/2015 18:27   Ct Angio Neck W/cm &/or Wo/cm  05/14/2015   CLINICAL DATA:  Sudden onset of difficulty speaking today. Acute infarction in the right parietal white matter and to a lesser extent in the left frontal white matter.  EXAM: CT ANGIOGRAPHY HEAD AND NECK  TECHNIQUE: Multidetector CT imaging of the head and neck was performed using the standard protocol during bolus administration of intravenous contrast.  Multiplanar CT image reconstructions and MIPs were obtained to evaluate the vascular anatomy. Carotid stenosis measurements (when applicable) are obtained utilizing NASCET criteria, using the distal internal carotid diameter as the denominator.  CONTRAST:  OMNIPAQUE IOHEXOL 350 MG/ML SOLN  COMPARISON:   MRI studies earlier same day  FINDINGS: CTA NECK  Aortic arch: Normal appearance of the arch. No stenosis, aneurysm or dissection. Branching pattern of the brachiocephalic vessels is normal.  Right carotid system: Wide patency of the common carotid artery to the bifurcation. Normal appearing bifurcation. Normal appearing cervical internal carotid artery.  Left carotid system: Wide patency of the common carotid artery to the bifurcation. Normal appearing bifurcation. Normal appearing cervical internal carotid artery.  Vertebral arteries:The right vertebral artery is dominant. Both vertebral artery origins are patent. New the right vertebral artery is patent through the cervical region. The left vertebral artery is a very small vessel. I think there may be stenosis in the proximal few cm, but there does appear to be some flow in the vessel distally.  Skeleton: Ordinary spondylosis C6-7.  No acute finding.  Other neck: No significant soft tissue lesion. Lung apices are clear.  CTA HEAD  Anterior circulation: Both internal carotid arteries are widely patent through the skullbase and carotid siphon regions. On the right, the anterior and middle cerebral vessels are patent without proximal stenosis. More distal MCA branch vessels do show some atherosclerotic irregularity. On the left, there is severe stenosis of the M1 segment on the left, 90% or greater. The patient would be at risk of large vessel left MCA infarction. No anterior cerebral stenosis on the left.  Posterior circulation: The right vertebral artery is widely patent at the foramen magnum and is patent to the basilar. The left vertebral artery does show some flow at the foramen magnum level, but appears to be occluded distally. There is probably retrograde flow back to PICA. There is atherosclerotic disease throughout the basilar artery, with the most severe stenosis measured at 50% in the midportion. Superior cerebellar and posterior cerebral arteries are  patent, but showed distal narrowing and irregularity. There are patent posterior communicating arteries bilaterally.  Venous sinuses: Patent and normal  Anatomic variants: None significant  Delayed phase: No abnormal enhancement  IMPRESSION: Findings consistent with previous MR angiogram.  No carotid bifurcation disease on either side.  Advanced intracranial atherosclerosis. Severe stenosis of the M1 segment on the left, 90% or greater. The patient would be at risk of large vessel left MCA territory infarction.  Distal left vertebral occlusion.  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  Distal intracranial vessel narrowing and irregularity.   Electronically Signed   By: Paulina Fusi M.D.   On: 05/14/2015 16:36   Mr Brain Wo Contrast  05/14/2015   CLINICAL DATA:  TIA. Mental status changes. Speech disturbance. Acute presentation yesterday.  EXAM: MRI HEAD WITHOUT CONTRAST  MRA HEAD WITHOUT CONTRAST  TECHNIQUE: Multiplanar, multiecho pulse sequences of the brain and surrounding structures were obtained without intravenous contrast. Angiographic images of the head were obtained using MRA technique without contrast.  COMPARISON:  CT 05/13/2015.  FINDINGS: MRI HEAD FINDINGS  Diffusion imaging shows patchy areas of acute infarction in the right parietal deep and subcortical white matter. No cortical infarction. In the left hemisphere, there is a punctate focus of acute infarction in the left posterior frontal subcortical white matter. No other area of acute insult.  The brainstem and  cerebellum are normal. Elsewhere in the cerebral hemispheres, there are a few areas of old small vessel infarction within the white matter. No mass lesion, hemorrhage, hydrocephalus or extra-axial collection. No pituitary mass. No inflammatory sinus disease. No skull or skullbase lesion.  MRA HEAD FINDINGS  Both internal carotid arteries are widely patent  into the brain. No siphon stenosis. On the right, the anterior and middle cerebral arteries are patent. No proximal stenosis. More distal branch vessels show some areas of narrowing and irregularity. On the left, the anterior and middle cerebral arteries are patent proximally. There is severe stenosis of the left M1 segment. More distal branch vessels do show either reconstituted flow or reduced antegrade flow.  The right vertebral artery is a large vessel widely patent to the basilar. No antegrade flow is present in the left vertebral artery. There is retrograde flow in the distal left vertebral artery to the anterior inferior cerebellar artery or Pike up. The basilar artery shows atherosclerotic disease with serial stenoses, the most severe measuring 50%. Superior cerebellar and posterior cerebral artery vessels are patent, but the distal branch vessels show pronounced atherosclerotic irregularity.  IMPRESSION: 2-3 cm region of acute infarction within the right parietal deep and subcortical white matter. Punctate focus of subcortical white matter infarction in the left posterior frontal lobe. No evidence of hemorrhage or mass effect.  Chronic small vessel ischemic changes elsewhere within the cerebral hemispheric white matter.  Diffuse intracranial atherosclerotic disease of the more distal branch vessels.  Severe stenosis of the left M1 segment placing the patient at risk of a large vessel territory MCA infarction. Consider neuro interventional treatment of this stenosis.  Occluded left vertebral artery.  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   Electronically Signed   By: Paulina Fusi M.D.   On: 05/14/2015 13:34   Mr Palma Holter  05/14/2015   CLINICAL DATA:  TIA. Mental status changes. Speech disturbance. Acute presentation yesterday.  EXAM: MRI HEAD WITHOUT CONTRAST  MRA HEAD WITHOUT CONTRAST  TECHNIQUE: Multiplanar, multiecho pulse sequences of the brain and surrounding  structures were obtained without intravenous contrast. Angiographic images of the head were obtained using MRA technique without contrast.  COMPARISON:  CT 05/13/2015.  FINDINGS: MRI HEAD FINDINGS  Diffusion imaging shows patchy areas of acute infarction in the right parietal deep and subcortical white matter. No cortical infarction. In the left hemisphere, there is a punctate focus of acute infarction in the left posterior frontal subcortical white matter. No other area of acute insult.  The brainstem and cerebellum are normal. Elsewhere in the cerebral hemispheres, there are a few areas of old small vessel infarction within the white matter. No mass lesion, hemorrhage, hydrocephalus or extra-axial collection. No pituitary mass. No inflammatory sinus disease. No skull or skullbase lesion.  MRA HEAD FINDINGS  Both internal carotid arteries are widely patent into the brain. No siphon stenosis. On the right, the anterior and middle cerebral arteries are patent. No proximal stenosis. More distal branch vessels show some areas of narrowing and irregularity. On the left, the anterior and middle cerebral arteries are patent proximally. There is severe stenosis of the left M1 segment. More distal branch vessels do show either reconstituted flow or reduced antegrade flow.  The right vertebral artery is a large vessel widely patent to the basilar. No antegrade flow is present in the left vertebral artery. There is retrograde flow in the distal left vertebral artery to the anterior inferior cerebellar artery or Jodi Marble  up. The basilar artery shows atherosclerotic disease with serial stenoses, the most severe measuring 50%. Superior cerebellar and posterior cerebral artery vessels are patent, but the distal branch vessels show pronounced atherosclerotic irregularity.  IMPRESSION: 2-3 cm region of acute infarction within the right parietal deep and subcortical white matter. Punctate focus of subcortical white matter infarction in  the left posterior frontal lobe. No evidence of hemorrhage or mass effect.  Chronic small vessel ischemic changes elsewhere within the cerebral hemispheric white matter.  Diffuse intracranial atherosclerotic disease of the more distal branch vessels.  Severe stenosis of the left M1 segment placing the patient at risk of a large vessel territory MCA infarction. Consider neuro interventional treatment of this stenosis.  Occluded left vertebral artery.  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   Electronically Signed   By: Paulina Fusi M.D.   On: 05/14/2015 13:34    EKG:   Orders placed or performed during the hospital encounter of 05/13/15  . ED EKG  . 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 CVA - permissive HTN   3) DM 2 - cbg's controlled on SSI here, holding lantus which he takes 45 u daily - A1C 7.1  4) OSA - continue CPAP  5) B12 deficiency - continue replacement - b12 400's  CODE STATUS: full  TOTAL TIME TAKING CARE OF THIS PATIENT: 30 minutes.  Greater than 50% of time spent in care coordination and counseling. Care plan discussed with patient and his mother at bedside and with Dr. Katrinka Blazing. POSSIBLE D/C 1-2 days depending on test and consult results.   Elby Showers M.D on 05/15/2015 at 11:24 AM  Between 7am to 6pm - Pager - [phone]  After 6pm go to [email or web address] - password [email or web address]  [facility]  Office  [phone]  CC: Primary care physician; Sherrie Mustache, MD

## 2015-05-15 NOTE — Plan of Care (Signed)
Problem: Discharge/Transitional Outcomes Goal: Other Discharge Outcomes/Goals Outcome: Progressing Plan of care progress to goal: Barriers to progression: none identified. Hemodynamics: VSS stable. Cardiac monitoring discontinued. Independent mobility: Pt worked with Physical therapy. Up ad lib independently. Family dynamics: Family involved in treatment plan.  NIH scale 0. Neuro checks wnl.

## 2015-05-15 NOTE — Progress Notes (Signed)
Roger Roger [name] is Roger [name] 49 y.o. male  [id number]  Primary Cardiologist: Adrian Blackwater Reason for Consultation: Consultation for transesophageal echocardiogram and CVA.  HPI: This is Roger [name] 49 year old male with the history of hypertension diabetes hyperlipidemia obesity sleep apnea presented to the emergency room with expressive aphasia, and tingling sensation in his left arm and right arm heaviness and paresthesias. Although said symptoms have resolved now.    Review of Systems: Review of Systems  Neurological: Positive for dizziness, tingling, tremors, sensory change and speech change.  All other systems reviewed and are negative.     Past Medical History  Diagnosis Date  . Hypertension   . Diabetes mellitus without complication     insulin-dependent  . Hyperlipidemia   . Obesity   . OSA on CPAP   . RAD (reactive airway disease)   . B12 deficiency     Medications Prior to Admission  Medication Sig Dispense Refill  . albuterol (PROVENTIL HFA;VENTOLIN HFA) 108 (90 BASE) MCG/ACT inhaler Inhale 2 puffs into the lungs every 6 (six) hours as needed for wheezing or shortness of breath.    Marland Kitchen amLODipine (NORVASC) 5 MG tablet Take 5 mg by mouth daily.    Marland Kitchen aspirin EC 81 MG tablet Take 81 mg by mouth daily.    . Exenatide ER (BYDUREON) 2 MG SRER Inject 2 mg into the skin once Roger [name] week. Pt uses on Sunday.    . furosemide (LASIX) 20 MG tablet Take 40 mg by mouth daily.     . insulin glargine (LANTUS) 100 UNIT/ML injection Inject 20-25 Units into the skin 2 (two) times daily. Pt uses 25 units in the morning and 20 units at bedtime.    Marland Kitchen losartan (COZAAR) 100 MG tablet Take 100 mg by mouth daily.    . metoprolol succinate (TOPROL-XL) 50 MG 24 hr tablet Take 75 mg by mouth daily.     . niacin (NIASPAN) 500 MG CR tablet Take 1,500 mg by mouth at bedtime.    . simvastatin (ZOCOR) 20 MG tablet Take 20 mg by mouth at bedtime.    . topiramate (TOPAMAX) 50 MG tablet Take 2 tablets (100 mg total) by mouth  2 (two) times daily. 60 tablet 1  . [DISCONTINUED] metFORMIN (GLUCOPHAGE) 1000 MG tablet Take 2,000 mg by mouth 2 (two) times daily.       Marland Kitchen aspirin  81 mg Oral Daily  . clopidogrel  75 mg Oral Daily  . enoxaparin (LOVENOX) injection  40 mg Subcutaneous QHS  . insulin aspart  0-5 Units Subcutaneous QHS  . insulin aspart  0-9 Units Subcutaneous TID WC  . simvastatin  20 mg Oral q1800  . sodium chloride  3 mL Intravenous Q12H  . vitamin B-12  1,000 mcg Oral Daily    Infusions:    No Known Allergies  Social History   Social History  . Marital Status: Single    Spouse Name: N/Roger [name]  . Number of Children: N/Roger [name]  . Years of Education: N/Roger [name]   Occupational History  . Not on file.   Social History Main Topics  . Smoking status: Never Smoker   . Smokeless tobacco: Not on file  . Alcohol Use: No  . Drug Use: No  . Sexual Activity: Not on file   Other Topics Concern  . Not on file   Social History Narrative    Family History  Problem Relation Age of Onset  . Diabetes Mother     PHYSICAL EXAM: Filed Vitals:  05/15/15 1338  BP: 153/98  Pulse: 87  Temp: 98 F (36.7 C)  Resp: 18     Intake/Output Summary (Last 24 hours) at 05/15/15 1656 Last data filed at 05/15/15 1200  Gross per 24 hour  Intake 1616.25 ml  Output      0 ml  Net 1616.25 ml    General:  Well appearing. No respiratory difficulty HEENT: normal Neck: supple. no JVD. Carotids 2+ bilat; no bruits. No lymphadenopathy or thryomegaly appreciated. Cor: PMI nondisplaced. Regular rate & rhythm. No rubs, gallops or murmurs. Lungs: clear Abdomen: soft, nontender, nondistended. No hepatosplenomegaly. No bruits or masses. Good bowel sounds. Extremities: no cyanosis, clubbing, rash, edema Neuro: alert & oriented x 3, cranial nerves grossly intact. moves all 4 extremities w/o difficulty. Affect pleasant.  ECG: Normal sinus rhythm and questionable old inferior wall MI with nonspecific ST-T changes  Results for  orders placed or performed during the hospital encounter of 05/13/15 (from the past 24 hour(s))  Glucose, capillary     Status: Abnormal   Collection Time: 05/14/15 10:35 PM  Result Value Ref Range   Glucose-Capillary 149 (H) 65 - 99 mg/dL  Glucose, capillary     Status: Abnormal   Collection Time: 05/15/15  7:15 AM  Result Value Ref Range   Glucose-Capillary 152 (H) 65 - 99 mg/dL  CBC     Status: None   Collection Time: 05/15/15  7:27 AM  Result Value Ref Range   WBC 10.0 3.8 - 10.6 K/uL   RBC 4.48 4.40 - 5.90 MIL/uL   Hemoglobin 14.5 13.0 - 18.0 g/dL   HCT 16.1 09.6 - 04.5 %   MCV 96.0 80.0 - 100.0 fL   MCH 32.5 26.0 - 34.0 pg   MCHC 33.8 32.0 - 36.0 g/dL   RDW 40.9 81.1 - 91.4 %   Platelets 253 150 - 440 K/uL  Basic metabolic panel     Status: Abnormal   Collection Time: 05/15/15  7:27 AM  Result Value Ref Range   Sodium 142 135 - 145 mmol/L   Potassium 3.8 3.5 - 5.1 mmol/L   Chloride 109 101 - 111 mmol/L   CO2 26 22 - 32 mmol/L   Glucose, Bld 168 (H) 65 - 99 mg/dL   BUN 10 6 - 20 mg/dL   Creatinine, Ser 7.82 0.61 - 1.24 mg/dL   Calcium 8.7 (L) 8.9 - 10.3 mg/dL   GFR calc non Af Amer >60 >60 mL/min   GFR calc Af Amer >60 >60 mL/min   Anion gap 7 5 - 15  Glucose, capillary     Status: Abnormal   Collection Time: 05/15/15 10:56 AM  Result Value Ref Range   Glucose-Capillary 144 (H) 65 - 99 mg/dL  Glucose, capillary     Status: Abnormal   Collection Time: 05/15/15  4:21 PM  Result Value Ref Range   Glucose-Capillary 146 (H) 65 - 99 mg/dL   Ct Angio Head W/cm &/or Wo Cm  05/14/2015   CLINICAL DATA:  Sudden onset of difficulty speaking today. Acute infarction in the right parietal white matter and to Roger [name] lesser extent in the left frontal white matter.  EXAM: CT ANGIOGRAPHY HEAD AND NECK  TECHNIQUE: Multidetector CT imaging of the head and neck was performed using the standard protocol during bolus administration of intravenous contrast. Multiplanar CT image reconstructions  and MIPs were obtained to evaluate the vascular anatomy. Carotid stenosis measurements (when applicable) are obtained utilizing NASCET criteria, using the distal internal carotid  diameter as the denominator.  CONTRAST:  OMNIPAQUE IOHEXOL 350 MG/ML SOLN  COMPARISON:  MRI studies earlier same day  FINDINGS: CTA NECK  Aortic arch: Normal appearance of the arch. No stenosis, aneurysm or dissection. Branching pattern of the brachiocephalic vessels is normal.  Right carotid system: Wide patency of the common carotid artery to the bifurcation. Normal appearing bifurcation. Normal appearing cervical internal carotid artery.  Left carotid system: Wide patency of the common carotid artery to the bifurcation. Normal appearing bifurcation. Normal appearing cervical internal carotid artery.  Vertebral arteries:The right vertebral artery is dominant. Both vertebral artery origins are patent. New the right vertebral artery is patent through the cervical region. The left vertebral artery is Roger [name] very small vessel. I think there may be stenosis in the proximal few cm, but there does appear to be some flow in the vessel distally.  Skeleton: Ordinary spondylosis C6-7.  No acute finding.  Other neck: No significant soft tissue lesion. Lung apices are clear.  CTA HEAD  Anterior circulation: Both internal carotid arteries are widely patent through the skullbase and carotid siphon regions. On the right, the anterior and middle cerebral vessels are patent without proximal stenosis. More distal MCA branch vessels do show some atherosclerotic irregularity. On the left, there is severe stenosis of the M1 segment on the left, 90% or greater. The patient would be at risk of large vessel left MCA infarction. No anterior cerebral stenosis on the left.  Posterior circulation: The right vertebral artery is widely patent at the foramen magnum and is patent to the basilar. The left vertebral artery does show some flow at the foramen magnum level,  but appears to be occluded distally. There is probably retrograde flow back to PICA. There is atherosclerotic disease throughout the basilar artery, with the most severe stenosis measured at 50% in the midportion. Superior cerebellar and posterior cerebral arteries are patent, but showed distal narrowing and irregularity. There are patent posterior communicating arteries bilaterally.  Venous sinuses: Patent and normal  Anatomic variants: None significant  Delayed phase: No abnormal enhancement  IMPRESSION: Findings consistent with previous MR angiogram.  No carotid bifurcation disease on either side.  Advanced intracranial atherosclerosis. Severe stenosis of the M1 segment on the left, 90% or greater. The patient would be at risk of large vessel left MCA territory infarction.  Distal left vertebral occlusion.  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  Distal intracranial vessel narrowing and irregularity.   Electronically Signed   By: Paulina Fusi M.D.   On: 05/14/2015 16:36   Ct Head Wo Contrast  05/13/2015   CLINICAL DATA:  Aphasia  EXAM: CT HEAD WITHOUT CONTRAST  TECHNIQUE: Contiguous axial images were obtained from the base of the skull through the vertex without intravenous contrast.  COMPARISON:  04/05/2015  FINDINGS: No skull fracture is noted. Paranasal sinuses and mastoid air cells are unremarkable.  No intracranial hemorrhage, mass effect or midline shift.  No acute cortical infarction. No mass lesion is noted in this unenhanced scan. The gray and white-matter differentiation is preserved. No intra or extra-axial fluid collection.  IMPRESSION: No acute intracranial abnormality.  No significant change.   Electronically Signed   By: Natasha Mead M.D.   On: 05/13/2015 18:27   Ct Angio Neck W/cm &/or Wo/cm  05/14/2015   CLINICAL DATA:  Sudden onset of difficulty speaking today. Acute infarction in the right parietal white  matter and to  Roger [name] lesser extent in the left frontal white matter.  EXAM: CT ANGIOGRAPHY HEAD AND NECK  TECHNIQUE: Multidetector CT imaging of the head and neck was performed using the standard protocol during bolus administration of intravenous contrast. Multiplanar CT image reconstructions and MIPs were obtained to evaluate the vascular anatomy. Carotid stenosis measurements (when applicable) are obtained utilizing NASCET criteria, using the distal internal carotid diameter as the denominator.  CONTRAST:  OMNIPAQUE IOHEXOL 350 MG/ML SOLN  COMPARISON:  MRI studies earlier same day  FINDINGS: CTA NECK  Aortic arch: Normal appearance of the arch. No stenosis, aneurysm or dissection. Branching pattern of the brachiocephalic vessels is normal.  Right carotid system: Wide patency of the common carotid artery to the bifurcation. Normal appearing bifurcation. Normal appearing cervical internal carotid artery.  Left carotid system: Wide patency of the common carotid artery to the bifurcation. Normal appearing bifurcation. Normal appearing cervical internal carotid artery.  Vertebral arteries:The right vertebral artery is dominant. Both vertebral artery origins are patent. New the right vertebral artery is patent through the cervical region. The left vertebral artery is Roger [name] very small vessel. I think there may be stenosis in the proximal few cm, but there does appear to be some flow in the vessel distally.  Skeleton: Ordinary spondylosis C6-7.  No acute finding.  Other neck: No significant soft tissue lesion. Lung apices are clear.  CTA HEAD  Anterior circulation: Both internal carotid arteries are widely patent through the skullbase and carotid siphon regions. On the right, the anterior and middle cerebral vessels are patent without proximal stenosis. More distal MCA branch vessels do show some atherosclerotic irregularity. On the left, there is severe stenosis of the M1 segment on the left, 90% or greater. The patient  would be at risk of large vessel left MCA infarction. No anterior cerebral stenosis on the left.  Posterior circulation: The right vertebral artery is widely patent at the foramen magnum and is patent to the basilar. The left vertebral artery does show some flow at the foramen magnum level, but appears to be occluded distally. There is probably retrograde flow back to PICA. There is atherosclerotic disease throughout the basilar artery, with the most severe stenosis measured at 50% in the midportion. Superior cerebellar and posterior cerebral arteries are patent, but showed distal narrowing and irregularity. There are patent posterior communicating arteries bilaterally.  Venous sinuses: Patent and normal  Anatomic variants: None significant  Delayed phase: No abnormal enhancement  IMPRESSION: Findings consistent with previous MR angiogram.  No carotid bifurcation disease on either side.  Advanced intracranial atherosclerosis. Severe stenosis of the M1 segment on the left, 90% or greater. The patient would be at risk of large vessel left MCA territory infarction.  Distal left vertebral occlusion.  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  Distal intracranial vessel narrowing and irregularity.   Electronically Signed   By: Paulina Fusi M.D.   On: 05/14/2015 16:36   Mr Brain Wo Contrast  05/14/2015   CLINICAL DATA:  TIA. Mental status changes. Speech disturbance. Acute presentation yesterday.  EXAM: MRI HEAD WITHOUT CONTRAST  MRA HEAD WITHOUT CONTRAST  TECHNIQUE: Multiplanar, multiecho pulse sequences of the brain and surrounding structures were obtained without intravenous contrast. Angiographic images of the head were obtained using MRA technique without contrast.  COMPARISON:  CT 05/13/2015.  FINDINGS: MRI HEAD FINDINGS  Diffusion imaging shows patchy areas of acute infarction in the right parietal deep  and subcortical white  matter. No cortical infarction. In the left hemisphere, there is Roger [name] punctate focus of acute infarction in the left posterior frontal subcortical white matter. No other area of acute insult.  The brainstem and cerebellum are normal. Elsewhere in the cerebral hemispheres, there are Roger [name] few areas of old small vessel infarction within the white matter. No mass lesion, hemorrhage, hydrocephalus or extra-axial collection. No pituitary mass. No inflammatory sinus disease. No skull or skullbase lesion.  MRA HEAD FINDINGS  Both internal carotid arteries are widely patent into the brain. No siphon stenosis. On the right, the anterior and middle cerebral arteries are patent. No proximal stenosis. More distal branch vessels show some areas of narrowing and irregularity. On the left, the anterior and middle cerebral arteries are patent proximally. There is severe stenosis of the left M1 segment. More distal branch vessels do show either reconstituted flow or reduced antegrade flow.  The right vertebral artery is Roger [name] large vessel widely patent to the basilar. No antegrade flow is present in the left vertebral artery. There is retrograde flow in the distal left vertebral artery to the anterior inferior cerebellar artery or Pike up. The basilar artery shows atherosclerotic disease with serial stenoses, the most severe measuring 50%. Superior cerebellar and posterior cerebral artery vessels are patent, but the distal branch vessels show pronounced atherosclerotic irregularity.  IMPRESSION: 2-3 cm region of acute infarction within the right parietal deep and subcortical white matter. Punctate focus of subcortical white matter infarction in the left posterior frontal lobe. No evidence of hemorrhage or mass effect.  Chronic small vessel ischemic changes elsewhere within the cerebral hemispheric white matter.  Diffuse intracranial atherosclerotic disease of the more distal branch vessels.  Severe stenosis of the left M1 segment placing the  patient at risk of Roger [name] large vessel territory MCA infarction. Consider neuro interventional treatment of this stenosis.  Occluded left vertebral artery.  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   Electronically Signed   By: Paulina Fusi M.D.   On: 05/14/2015 13:34   Mr Palma Holter  05/14/2015   CLINICAL DATA:  TIA. Mental status changes. Speech disturbance. Acute presentation yesterday.  EXAM: MRI HEAD WITHOUT CONTRAST  MRA HEAD WITHOUT CONTRAST  TECHNIQUE: Multiplanar, multiecho pulse sequences of the brain and surrounding structures were obtained without intravenous contrast. Angiographic images of the head were obtained using MRA technique without contrast.  COMPARISON:  CT 05/13/2015.  FINDINGS: MRI HEAD FINDINGS  Diffusion imaging shows patchy areas of acute infarction in the right parietal deep and subcortical white matter. No cortical infarction. In the left hemisphere, there is Roger [name] punctate focus of acute infarction in the left posterior frontal subcortical white matter. No other area of acute insult.  The brainstem and cerebellum are normal. Elsewhere in the cerebral hemispheres, there are Roger [name] few areas of old small vessel infarction within the white matter. No mass lesion, hemorrhage, hydrocephalus or extra-axial collection. No pituitary mass. No inflammatory sinus disease. No skull or skullbase lesion.  MRA HEAD FINDINGS  Both internal carotid arteries are widely patent into the brain. No siphon stenosis. On the right, the anterior and middle cerebral arteries are patent. No proximal stenosis. More distal branch vessels show some areas of narrowing and irregularity. On the left, the anterior and middle cerebral arteries are patent proximally. There is severe stenosis of the left M1 segment. More distal branch vessels do show either reconstituted flow or reduced antegrade flow.  The right vertebral artery  is Roger [name] large vessel widely patent to the basilar. No antegrade flow is present  in the left vertebral artery. There is retrograde flow in the distal left vertebral artery to the anterior inferior cerebellar artery or Pike up. The basilar artery shows atherosclerotic disease with serial stenoses, the most severe measuring 50%. Superior cerebellar and posterior cerebral artery vessels are patent, but the distal branch vessels show pronounced atherosclerotic irregularity.  IMPRESSION: 2-3 cm region of acute infarction within the right parietal deep and subcortical white matter. Punctate focus of subcortical white matter infarction in the left posterior frontal lobe. No evidence of hemorrhage or mass effect.  Chronic small vessel ischemic changes elsewhere within the cerebral hemispheric white matter.  Diffuse intracranial atherosclerotic disease of the more distal branch vessels.  Severe stenosis of the left M1 segment placing the patient at risk of Roger [name] large vessel territory MCA infarction. Consider neuro interventional treatment of this stenosis.  Occluded left vertebral artery.  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   Electronically Signed   By: Paulina Fusi M.D.   On: 05/14/2015 13:34     ASSESSMENT AND PLAN: TIA with speech being affected which has resolved now. Advise by neurology to do TEE. Patient will be set up for TEE in the morning.  Roger Roger [name],Roger Roger [name]

## 2015-05-15 NOTE — Progress Notes (Signed)
Pt cardiac monitoring expired. Notified Dr Anne Hahn to see if he wanted to reorder the telemetry monitoring. He said he would place an order to discontinue.Geri [place], RN

## 2015-05-15 NOTE — Plan of Care (Signed)
Problem: Discharge/Transitional Outcomes Goal: Other Discharge Outcomes/Goals Outcome: Progressing VSS. Neuro checks WDL. INH scale score 0. Denies pain. Tolerates diet. Ambulates independently. TEE and cardiology consult pending.

## 2015-05-15 NOTE — Progress Notes (Signed)
NEUROLOGY NOTE  S: Pt has no complaints and wants to go home.  ROS neg x 8 systems  O: 98.0 153/98 87 18 NAD, overweight Normocephalic, oropharynx clear Supple, no JVD CTA B, no wheezing RRR, no murmurs No C/C/E  Alert and oriented x 3, nl speech and language PERRLA, EOMI, face symmetric 5/5 B, nl tone  CTA personally reviewed by me and shows 90% stenosis of L M1, diffuse intracranial atherosclerosis  A/P: 1. L M1 occlusion- stable, This is likely the cause of current symptoms and pt can worsen if there is still an occlusion. Right now he is asymptomatic from this but there is a large potential worsening. 2. 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

## 2015-05-16 ENCOUNTER — Encounter
Admission: EM | Disposition: A | Discharge status: Home / Self Care | Payer: Self-pay | Source: Home / Self Care | Attending: Internal Medicine

## 2015-05-16 ENCOUNTER — Inpatient Hospital Stay
Admit: 2015-05-16 | Discharge: 2015-05-16 | Disposition: A | Discharge status: Home / Self Care | Payer: Commercial Managed Care - PPO | Attending: Cardiovascular Disease | Admitting: Cardiovascular Disease

## 2015-05-16 DIAGNOSIS — I639 Cerebral infarction, unspecified: Secondary | ICD-10-CM | POA: Diagnosis present

## 2015-05-16 LAB — GLUCOSE, CAPILLARY
Glucose-Capillary: 171 mg/dL — ABNORMAL HIGH (ref 65–99)
Glucose-Capillary: 210 mg/dL — ABNORMAL HIGH (ref 65–99)

## 2015-05-16 SURGERY — Surgical Case
Anesthesia: *Unknown

## 2015-05-16 SURGERY — ECHOCARDIOGRAM, TRANSESOPHAGEAL
Anesthesia: Moderate Sedation

## 2015-05-16 MED ORDER — BUTAMBEN-TETRACAINE-BENZOCAINE 2-2-14 % EX AERO
INHALATION_SPRAY | CUTANEOUS | Status: AC
  Filled 2015-05-16: qty 20

## 2015-05-16 MED ORDER — FENTANYL CITRATE (PF) 100 MCG/2ML IJ SOLN
INTRAMUSCULAR | Status: AC
  Filled 2015-05-16: qty 4

## 2015-05-16 MED ORDER — PNEUMOCOCCAL VAC POLYVALENT 25 MCG/0.5ML IJ INJ: 0.5000 mL | INJECTION | INTRAMUSCULAR | Status: DC

## 2015-05-16 MED ORDER — MIDAZOLAM HCL 5 MG/5ML IJ SOLN
INTRAMUSCULAR | Status: AC
  Filled 2015-05-16: qty 10

## 2015-05-16 MED ORDER — FENTANYL CITRATE (PF) 100 MCG/2ML IJ SOLN
INTRAMUSCULAR | Status: AC | PRN
  Administered 2015-05-16: 50 ug via INTRAVENOUS
  Administered 2015-05-16: 25 ug via INTRAVENOUS

## 2015-05-16 MED ORDER — LORAZEPAM 2 MG/ML IJ SOLN
INTRAMUSCULAR | Status: AC | PRN
  Administered 2015-05-16: 1 mg via INTRAVENOUS

## 2015-05-16 MED ORDER — CLOPIDOGREL BISULFATE 75 MG PO TABS: 75.0000 mg | ORAL_TABLET | Freq: Every day | ORAL | Status: DC

## 2015-05-16 MED ORDER — LIDOCAINE VISCOUS 2 % MT SOLN
OROMUCOSAL | Status: AC
  Filled 2015-05-16: qty 15

## 2015-05-16 MED ORDER — INFLUENZA VAC SPLIT QUAD 0.5 ML IM SUSY: 0.5000 mL | PREFILLED_SYRINGE | INTRAMUSCULAR | Status: DC

## 2015-05-16 MED ORDER — MIDAZOLAM HCL 2 MG/2ML IJ SOLN
INTRAMUSCULAR | Status: AC | PRN
  Administered 2015-05-16: 1 mg via INTRAVENOUS

## 2015-05-16 NOTE — Progress Notes (Signed)
Pt discharged home per MD order. IV removed. Discharge instructions, prescription, medicines, follow up care, activity and diet education given to patient. Stroke education given. All questions answered. Pt verbalized understanding. Pt to leave with mom at bedside.

## 2015-05-16 NOTE — Plan of Care (Signed)
Problem: Discharge/Transitional Outcomes Goal: Other Discharge Outcomes/Goals Outcome: Progressing Plan of care progress to goal: VSS with slightly elevated BP NIH score of 0 Neuro cks WDL NPO since midnight in preparation of TEE this am

## 2015-05-16 NOTE — Progress Notes (Signed)
   05/16/15 1000  Clinical Encounter Type  Visited With Patient and family together  Visit Type Initial  Spiritual Encounters  Spiritual Needs Prayer  Provided pastoral presence, support and prayer to patient and his mother in room.  [employer] [place]-pager [phone]

## 2015-05-16 NOTE — Progress Notes (Signed)
*  PRELIMINARY RESULTS* Echocardiogram Echocardiogram Transesophageal has been performed.  Roger [name] 05/16/2015, 8:35 AM

## 2015-05-16 NOTE — Discharge Summary (Signed)
[facility] [place] at [facility]  DISCHARGE SUMMARY   PATIENT NAME: Cardale Dorer    MR#:  [id number]  DATE OF BIRTH:  [birth year]  DATE OF ADMISSION:  05/13/2015 ADMITTING PHYSICIAN: Oralia Manis, MD  DATE OF DISCHARGE: 05/16/2015  PRIMARY CARE PHYSICIAN: Sherrie Mustache, MD    ADMISSION DIAGNOSIS:  Aphagia [R13.0]  DISCHARGE DIAGNOSIS:  Principal Problem:   CVA (cerebral infarction) Active Problems:   B12 deficiency   TIA (transient ischemic attack)   HTN (hypertension)   HLD (hyperlipidemia)   OSA on CPAP   Type 2 diabetes mellitus   SECONDARY DIAGNOSIS:   Past Medical History  Diagnosis Date  . Hypertension   . Diabetes mellitus without complication     insulin-dependent  . Hyperlipidemia   . Obesity   . OSA on CPAP   . RAD (reactive airway disease)   . B12 deficiency     HOSPITAL COURSE:   1) CVA with acute infarction within the right parietal deep matter measuring 2-3 cm, subacute infarct in the left posterior frontal lobe. CTA showing severe stenosis of left M1 segment. Patient was followed by neurology throughout the hospitalization. He was started on aspirin and Plavix. He had carotid Dopplers and 2-D echocardiogram prior to admission in the outpatient setting and these were normal. He had a TEE E during this hospitalization which was also normal with no source identified for embolic stroke. He will follow-up with cardiology within 1 week for placement of an event monitor. Lipid panel and hemoglobin A1c both within acceptable levels. Hypercoagulability panel has been sent and results are pending. At this time he is asymptomatic. He was evaluated by physical and speech therapy and does not need any further therapy.  2) hypertension: Allowed for permissive hypertension during the hospitalization. Now restarting home antihypertensives medications with goal blood pressure less than 130/90. He has a home blood pressure cuff and will check  his blood pressure daily. He'll bring readings to his next doctor's appointment.  3) diabetes mellitus type 2: Hemoglobin A1c is 7.1. Resume home regimen. Of note his blood sugars were well controlled in hospital on the diabetic diet and sliding scale insulin with fairly small insulin requirement. I suspect his diet is very poorly controlled in outpatient setting. Will bring glucose log to next doctor's appointment.  4) OSA: CPAP continued during hospitalization  5) B12 deficiency: B12 level in the 400s. He'll continue replacement per his outpatient regimen.  DISCHARGE CONDITIONS:   Fair  CONSULTS OBTAINED:  Treatment Team:  Mellody Drown, MD Laurier Nancy, MD  DRUG ALLERGIES:  No Known Allergies  DISCHARGE MEDICATIONS:   Current Discharge Medication List    START taking these medications   Details  clopidogrel (PLAVIX) 75 MG tablet Take 1 tablet (75 mg total) by mouth daily. Qty: 30 tablet, Refills: 1      CONTINUE these medications which have CHANGED   Details  metFORMIN (GLUCOPHAGE) 1000 MG tablet Take 1 tablet (1,000 mg total) by mouth 2 (two) times daily with a meal. Qty: 60 tablet, Refills: 0      CONTINUE these medications which have NOT CHANGED   Details  albuterol (PROVENTIL HFA;VENTOLIN HFA) 108 (90 BASE) MCG/ACT inhaler Inhale 2 puffs into the lungs every 6 (six) hours as needed for wheezing or shortness of breath.    amLODipine (NORVASC) 5 MG tablet Take 5 mg by mouth daily.    aspirin EC 81 MG tablet Take 81 mg by mouth daily.  Exenatide ER (BYDUREON) 2 MG SRER Inject 2 mg into the skin once a week. Pt uses on Sunday.    furosemide (LASIX) 20 MG tablet Take 40 mg by mouth daily.     insulin glargine (LANTUS) 100 UNIT/ML injection Inject 20-25 Units into the skin 2 (two) times daily. Pt uses 25 units in the morning and 20 units at bedtime.    losartan (COZAAR) 100 MG tablet Take 100 mg by mouth daily.    metoprolol succinate (TOPROL-XL) 50 MG 24 hr  tablet Take 75 mg by mouth daily.     niacin (NIASPAN) 500 MG CR tablet Take 1,500 mg by mouth at bedtime.    simvastatin (ZOCOR) 20 MG tablet Take 20 mg by mouth at bedtime.    topiramate (TOPAMAX) 50 MG tablet Take 2 tablets (100 mg total) by mouth 2 (two) times daily. Qty: 60 tablet, Refills: 1   Associated Diagnoses: Tingling in extremities; Carpal tunnel syndrome of left wrist         DISCHARGE INSTRUCTIONS:   Patient being discharged home. No home health needs. He is in stable condition. He will continue to follow a diabetic heart healthy diet.  If you experience worsening of your admission symptoms, develop shortness of breath, life threatening emergency, suicidal or homicidal thoughts you must seek medical attention immediately by calling 911 or calling your MD immediately  if symptoms less severe.  You Must read complete instructions/literature along with all the possible adverse reactions/side effects for all the Medicines you take and that have been prescribed to you. Take any new Medicines after you have completely understood and accept all the possible adverse reactions/side effects.   Please note  You were cared for by a hospitalist during your hospital stay. If you have any questions about your discharge medications or the care you received while you were in the hospital after you are discharged, you can call the unit and asked to speak with the hospitalist on call if the hospitalist that took care of you is not available. Once you are discharged, your primary care physician will handle any further medical issues. Please note that NO REFILLS for any discharge medications will be authorized once you are discharged, as it is imperative that you return to your primary care physician (or establish a relationship with a primary care physician if you do not have one) for your aftercare needs so that they can reassess your need for medications and monitor your lab values.    Today    CHIEF COMPLAINT:   Chief Complaint  Patient presents with  . Aphasia    HISTORY OF PRESENT ILLNESS:  Carsyn Taubman is a 49 y.o. male who presents with an episode of expressive aphasia. The patient has had multiple neurological complaints over the past several weeks, which he has been getting worked up in the outpatient setting. He complained initially of peripheral paresthesias, specifically left hand numbness, right arm heaviness, some leg paresthesias with some difficulty walking. He saw his prior care physician and her neurologist, was diagnosed with low vitamin B12 level and started on supplements. However he also had outpatient MRI, carotid ultrasound, echocardiogram ordered. Per his report the carotid ultrasound and echocardiogram were done and were within normal limits, and he was waiting to have his MRI done. He states that his symptoms started improving once he started taking the B12 supplements. However, earlier today he was at work and began experiencing some expressive aphasia. He states that he knew anyone to say,  but was aware that he was not saying the words that he was thinking. He was told by his coworkers that he was speaking gibberish. Patient states that he felt a "fog" over his mind, and thought that this episode lasted just a few moments, but that his coworkers told him afterwards that the episode lasted more like 15 minutes. He came to ED for further evaluation. Initial labs, EKG, and CT head without contrast on within normal limits, patient's symptoms have resolved at this time, but hospitalists were called for admission for TIA/likely CVA.  VITAL SIGNS:  Blood pressure 158/94, pulse 83, temperature 98 F (36.7 C), temperature source Oral, resp. rate 18, height  (1.702 m), weight 101.606 kg (224 lb), SpO2 96 %.  I/O:   Intake/Output Summary (Last 24 hours) at 05/16/15 1135 Last data filed at 05/15/15 1700  Gross per 24 hour  Intake    360 ml  Output      0 ml   Net    360 ml    PHYSICAL EXAMINATION:  GENERAL:  49 y.o.-year-old patient lying in the bed with no acute distress. Obese EYES: Pupils equal, round, reactive to light and accommodation. No scleral icterus. Extraocular muscles intact.  HEENT: Head atraumatic, normocephalic. Oropharynx and nasopharynx clear.  NECK:  Supple, no jugular venous distention. No thyroid enlargement, no tenderness.  LUNGS: Normal breath sounds bilaterally, no wheezing, rales,rhonchi or crepitation. No use of accessory muscles of respiration.  CARDIOVASCULAR: S1, S2 normal. No murmurs, rubs, or gallops.  ABDOMEN: Soft, non-tender, non-distended. Bowel sounds present. No organomegaly or mass.  EXTREMITIES: No pedal edema, cyanosis, or clubbing.  NEUROLOGIC: Cranial nerves II through XII are intact. Muscle strength 5/5 in all extremities. Sensation intact. Gait not checked.  PSYCHIATRIC: The patient is alert and oriented x 3.  SKIN: No obvious rash, lesion, or ulcer.   DATA REVIEW:   CBC  Recent Labs Lab 05/15/15 0727  WBC 10.0  HGB 14.5  HCT 43.0  PLT 253    Chemistries   Recent Labs Lab 05/13/15 1807  05/15/15 0727  NA 140  < > 142  K 3.7  < > 3.8  CL 103  < > 109  CO2 27  < > 26  GLUCOSE 122*  < > 168*  BUN 14  < > 10  CREATININE 0.82  < > 0.70  CALCIUM 9.5  < > 8.7*  AST 24  --   --   ALT 18  --   --   ALKPHOS 60  --   --   BILITOT 0.9  --   --   < > = values in this interval not displayed.  Cardiac Enzymes  Recent Labs Lab 05/14/15 0025  TROPONINI <0.03    Microbiology Results  No results found for this or any previous visit.  RADIOLOGY:  Ct Angio Head W/cm &/or Wo Cm  05/14/2015   CLINICAL DATA:  Sudden onset of difficulty speaking today. Acute infarction in the right parietal white matter and to a lesser extent in the left frontal white matter.  EXAM: CT ANGIOGRAPHY HEAD AND NECK  TECHNIQUE: Multidetector CT imaging of the head and neck was performed using the standard  protocol during bolus administration of intravenous contrast. Multiplanar CT image reconstructions and MIPs were obtained to evaluate the vascular anatomy. Carotid stenosis measurements (when applicable) are obtained utilizing NASCET criteria, using the distal internal carotid diameter as the denominator.  CONTRAST:  OMNIPAQUE IOHEXOL 350 MG/ML SOLN  COMPARISON:  MRI studies earlier same day  FINDINGS: CTA NECK  Aortic arch: Normal appearance of the arch. No stenosis, aneurysm or dissection. Branching pattern of the brachiocephalic vessels is normal.  Right carotid system: Wide patency of the common carotid artery to the bifurcation. Normal appearing bifurcation. Normal appearing cervical internal carotid artery.  Left carotid system: Wide patency of the common carotid artery to the bifurcation. Normal appearing bifurcation. Normal appearing cervical internal carotid artery.  Vertebral arteries:The right vertebral artery is dominant. Both vertebral artery origins are patent. New the right vertebral artery is patent through the cervical region. The left vertebral artery is a very small vessel. I think there may be stenosis in the proximal few cm, but there does appear to be some flow in the vessel distally.  Skeleton: Ordinary spondylosis C6-7.  No acute finding.  Other neck: No significant soft tissue lesion. Lung apices are clear.  CTA HEAD  Anterior circulation: Both internal carotid arteries are widely patent through the skullbase and carotid siphon regions. On the right, the anterior and middle cerebral vessels are patent without proximal stenosis. More distal MCA branch vessels do show some atherosclerotic irregularity. On the left, there is severe stenosis of the M1 segment on the left, 90% or greater. The patient would be at risk of large vessel left MCA infarction. No anterior cerebral stenosis on the left.  Posterior circulation: The right vertebral artery is widely patent at the foramen magnum and is  patent to the basilar. The left vertebral artery does show some flow at the foramen magnum level, but appears to be occluded distally. There is probably retrograde flow back to PICA. There is atherosclerotic disease throughout the basilar artery, with the most severe stenosis measured at 50% in the midportion. Superior cerebellar and posterior cerebral arteries are patent, but showed distal narrowing and irregularity. There are patent posterior communicating arteries bilaterally.  Venous sinuses: Patent and normal  Anatomic variants: None significant  Delayed phase: No abnormal enhancement  IMPRESSION: Findings consistent with previous MR angiogram.  No carotid bifurcation disease on either side.  Advanced intracranial atherosclerosis. Severe stenosis of the M1 segment on the left, 90% or greater. The patient would be at risk of large vessel left MCA territory infarction.  Distal left vertebral occlusion.  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  Distal intracranial vessel narrowing and irregularity.   Electronically Signed   By: Paulina Fusi M.D.   On: 05/14/2015 16:36   Ct Angio Neck W/cm &/or Wo/cm  05/14/2015   CLINICAL DATA:  Sudden onset of difficulty speaking today. Acute infarction in the right parietal white matter and to a lesser extent in the left frontal white matter.  EXAM: CT ANGIOGRAPHY HEAD AND NECK  TECHNIQUE: Multidetector CT imaging of the head and neck was performed using the standard protocol during bolus administration of intravenous contrast. Multiplanar CT image reconstructions and MIPs were obtained to evaluate the vascular anatomy. Carotid stenosis measurements (when applicable) are obtained utilizing NASCET criteria, using the distal internal carotid diameter as the denominator.  CONTRAST:  OMNIPAQUE IOHEXOL 350 MG/ML SOLN  COMPARISON:  MRI studies earlier same day  FINDINGS: CTA NECK  Aortic  arch: Normal appearance of the arch. No stenosis, aneurysm or dissection. Branching pattern of the brachiocephalic vessels is normal.  Right carotid system: Wide patency of the common carotid artery to the bifurcation. Normal appearing bifurcation. Normal appearing cervical internal carotid  artery.  Left carotid system: Wide patency of the common carotid artery to the bifurcation. Normal appearing bifurcation. Normal appearing cervical internal carotid artery.  Vertebral arteries:The right vertebral artery is dominant. Both vertebral artery origins are patent. New the right vertebral artery is patent through the cervical region. The left vertebral artery is a very small vessel. I think there may be stenosis in the proximal few cm, but there does appear to be some flow in the vessel distally.  Skeleton: Ordinary spondylosis C6-7.  No acute finding.  Other neck: No significant soft tissue lesion. Lung apices are clear.  CTA HEAD  Anterior circulation: Both internal carotid arteries are widely patent through the skullbase and carotid siphon regions. On the right, the anterior and middle cerebral vessels are patent without proximal stenosis. More distal MCA branch vessels do show some atherosclerotic irregularity. On the left, there is severe stenosis of the M1 segment on the left, 90% or greater. The patient would be at risk of large vessel left MCA infarction. No anterior cerebral stenosis on the left.  Posterior circulation: The right vertebral artery is widely patent at the foramen magnum and is patent to the basilar. The left vertebral artery does show some flow at the foramen magnum level, but appears to be occluded distally. There is probably retrograde flow back to PICA. There is atherosclerotic disease throughout the basilar artery, with the most severe stenosis measured at 50% in the midportion. Superior cerebellar and posterior cerebral arteries are patent, but showed distal narrowing and irregularity. There  are patent posterior communicating arteries bilaterally.  Venous sinuses: Patent and normal  Anatomic variants: None significant  Delayed phase: No abnormal enhancement  IMPRESSION: Findings consistent with previous MR angiogram.  No carotid bifurcation disease on either side.  Advanced intracranial atherosclerosis. Severe stenosis of the M1 segment on the left, 90% or greater. The patient would be at risk of large vessel left MCA territory infarction.  Distal left vertebral occlusion.  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  Distal intracranial vessel narrowing and irregularity.   Electronically Signed   By: Paulina Fusi M.D.   On: 05/14/2015 16:36   Mr Brain Wo Contrast  05/14/2015   CLINICAL DATA:  TIA. Mental status changes. Speech disturbance. Acute presentation yesterday.  EXAM: MRI HEAD WITHOUT CONTRAST  MRA HEAD WITHOUT CONTRAST  TECHNIQUE: Multiplanar, multiecho pulse sequences of the brain and surrounding structures were obtained without intravenous contrast. Angiographic images of the head were obtained using MRA technique without contrast.  COMPARISON:  CT 05/13/2015.  FINDINGS: MRI HEAD FINDINGS  Diffusion imaging shows patchy areas of acute infarction in the right parietal deep and subcortical white matter. No cortical infarction. In the left hemisphere, there is a punctate focus of acute infarction in the left posterior frontal subcortical white matter. No other area of acute insult.  The brainstem and cerebellum are normal. Elsewhere in the cerebral hemispheres, there are a few areas of old small vessel infarction within the white matter. No mass lesion, hemorrhage, hydrocephalus or extra-axial collection. No pituitary mass. No inflammatory sinus disease. No skull or skullbase lesion.  MRA HEAD FINDINGS  Both internal carotid arteries are widely patent into the brain. No siphon stenosis. On the right, the  anterior and middle cerebral arteries are patent. No proximal stenosis. More distal branch vessels show some areas of narrowing and irregularity. On the left, the anterior and middle cerebral arteries are patent  proximally. There is severe stenosis of the left M1 segment. More distal branch vessels do show either reconstituted flow or reduced antegrade flow.  The right vertebral artery is a large vessel widely patent to the basilar. No antegrade flow is present in the left vertebral artery. There is retrograde flow in the distal left vertebral artery to the anterior inferior cerebellar artery or Pike up. The basilar artery shows atherosclerotic disease with serial stenoses, the most severe measuring 50%. Superior cerebellar and posterior cerebral artery vessels are patent, but the distal branch vessels show pronounced atherosclerotic irregularity.  IMPRESSION: 2-3 cm region of acute infarction within the right parietal deep and subcortical white matter. Punctate focus of subcortical white matter infarction in the left posterior frontal lobe. No evidence of hemorrhage or mass effect.  Chronic small vessel ischemic changes elsewhere within the cerebral hemispheric white matter.  Diffuse intracranial atherosclerotic disease of the more distal branch vessels.  Severe stenosis of the left M1 segment placing the patient at risk of a large vessel territory MCA infarction. Consider neuro interventional treatment of this stenosis.  Occluded left vertebral artery.  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   Electronically Signed   By: Paulina Fusi M.D.   On: 05/14/2015 13:34   Mr Palma Holter  05/14/2015   CLINICAL DATA:  TIA. Mental status changes. Speech disturbance. Acute presentation yesterday.  EXAM: MRI HEAD WITHOUT CONTRAST  MRA HEAD WITHOUT CONTRAST  TECHNIQUE: Multiplanar, multiecho pulse sequences of the brain and surrounding structures were obtained without intravenous contrast.  Angiographic images of the head were obtained using MRA technique without contrast.  COMPARISON:  CT 05/13/2015.  FINDINGS: MRI HEAD FINDINGS  Diffusion imaging shows patchy areas of acute infarction in the right parietal deep and subcortical white matter. No cortical infarction. In the left hemisphere, there is a punctate focus of acute infarction in the left posterior frontal subcortical white matter. No other area of acute insult.  The brainstem and cerebellum are normal. Elsewhere in the cerebral hemispheres, there are a few areas of old small vessel infarction within the white matter. No mass lesion, hemorrhage, hydrocephalus or extra-axial collection. No pituitary mass. No inflammatory sinus disease. No skull or skullbase lesion.  MRA HEAD FINDINGS  Both internal carotid arteries are widely patent into the brain. No siphon stenosis. On the right, the anterior and middle cerebral arteries are patent. No proximal stenosis. More distal branch vessels show some areas of narrowing and irregularity. On the left, the anterior and middle cerebral arteries are patent proximally. There is severe stenosis of the left M1 segment. More distal branch vessels do show either reconstituted flow or reduced antegrade flow.  The right vertebral artery is a large vessel widely patent to the basilar. No antegrade flow is present in the left vertebral artery. There is retrograde flow in the distal left vertebral artery to the anterior inferior cerebellar artery or Pike up. The basilar artery shows atherosclerotic disease with serial stenoses, the most severe measuring 50%. Superior cerebellar and posterior cerebral artery vessels are patent, but the distal branch vessels show pronounced atherosclerotic irregularity.  IMPRESSION: 2-3 cm region of acute infarction within the right parietal deep and subcortical white matter. Punctate focus of subcortical white matter infarction in the left posterior frontal lobe. No evidence of  hemorrhage or mass effect.  Chronic small vessel ischemic changes elsewhere within the cerebral hemispheric white matter.  Diffuse intracranial atherosclerotic disease of the more distal branch vessels.  Severe  stenosis of the left M1 segment placing the patient at risk of a large vessel territory MCA infarction. Consider neuro interventional treatment of this stenosis.  Occluded left vertebral artery.  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   Electronically Signed   By: Paulina Fusi M.D.   On: 05/14/2015 13:34    EKG:   Orders placed or performed during the hospital encounter of 05/13/15  . ED EKG  . ED EKG      Management plans discussed with the patient, family and they are in agreement.  CODE STATUS:     Code Status Orders        Start     Ordered   05/13/15 2346  Full code   Continuous     05/13/15 2345      TOTAL TIME TAKING CARE OF THIS PATIENT: 35 minutes.  Greater than 50% of time spent in care coordination and counseling. Care plan discussed with the patient and his mother at the bedside prior to discharge.  Elby Showers M.D on 05/16/2015 at 11:35 AM  Between 7am to 6pm - Pager - [phone]  After 6pm go to [email or web address] - password [email or web address]  [place] [facility]  Office  [phone]  CC: Primary care physician; Sherrie Mustache, MD

## 2015-05-16 NOTE — Progress Notes (Signed)
SUBJECTIVE: Patient is feeling much better and is able to talk without any aphasia or dysarthria   Filed Vitals:   05/15/15 2127 05/16/15 0143 05/16/15 0427 05/16/15 0820  BP: 152/90 141/74 127/78 161/100  Pulse: 83 80 78   Temp: 98.6 F (37 C) 97.8 F (36.6 C) 97.7 F (36.5 C)   TempSrc: Axillary Oral Oral   Resp: Height:      Weight:      SpO2: 97% 99% 100% 94%    Intake/Output Summary (Last 24 hours) at 05/16/15 0836 Last data filed at 05/15/15 1700  Gross per 24 hour  Intake    360 ml  Output      0 ml  Net    360 ml    LABS: Basic Metabolic Panel:  Recent Labs  40/98/11 0025 05/15/15 0727  NA 141 142  K 3.8 3.8  CL 105 109  CO2 28 26  GLUCOSE 138* 168*  BUN 14 10  CREATININE 0.78 0.70  CALCIUM 9.1 8.7*   Liver Function Tests:  Recent Labs  05/13/15 1807  AST 24  ALT 18  ALKPHOS 60  BILITOT 0.9  PROT 7.8  ALBUMIN 4.3   No results for input(s): LIPASE, AMYLASE in the last 72 hours. CBC:  Recent Labs  05/13/15 1807 05/14/15 0025 05/15/15 0727  WBC 11.7* 12.0* 10.0  NEUTROABS 8.0*  --   --   HGB 15.2 14.8 14.5  HCT 44.3 43.8 43.0  MCV 96.4 97.5 96.0  PLT 278 264 253   Cardiac Enzymes:  Recent Labs  05/14/15 0025  TROPONINI <0.03   BNP: Invalid input(s): POCBNP D-Dimer: No results for input(s): DDIMER in the last 72 hours. Hemoglobin A1C:  Recent Labs  05/14/15 0025  HGBA1C 7.1*   Fasting Lipid Panel:  Recent Labs  05/14/15 0025  CHOL 140  HDL 29*  LDLCALC 67  TRIG 914*  CHOLHDL 4.8   Thyroid Function Tests: No results for input(s): TSH, T4TOTAL, T3FREE, THYROIDAB in the last 72 hours.  Invalid input(s): FREET3 Anemia Panel:  Recent Labs  05/14/15 0025  VITAMINB12 407     PHYSICAL EXAM General: Well developed, well nourished, in no acute distress HEENT:  Normocephalic and atramatic Neck:  No JVD.  Lungs: Clear bilaterally to auscultation and percussion. Heart: HRRR . Normal S1 and S2  without gallops or murmurs.  Abdomen: Bowel sounds are positive, abdomen soft and non-tender  Msk:  Back normal, normal gait. Normal strength and tone for age. Extremities: No clubbing, cyanosis or edema.   Neuro: Alert and oriented X 3. Psych:  Good affect, responds appropriately  TELEMETRY: Sinus rhythm  ASSESSMENT AND PLAN: TIA/aphasia, has resolved. I did transesophageal echocardiogram this morning which showed normal left vetricula systolic function and mild mitral regurgitation, left atrial appendage had no thrombi along with left atrium and LV. Bubble study was done and there is no evidence of patent foramen ovale. Patient can go home with follow-up in the office next week.  Principal Problem:   TIA (transient ischemic attack) Active Problems:   B12 deficiency   HTN (hypertension)   HLD (hyperlipidemia)   OSA on CPAP   Type 2 diabetes mellitus    KHAN,SHAUKAT A, MD, [facility] 05/16/2015 8:36 AM

## 2015-05-16 NOTE — Discharge Instructions (Signed)
DIET:  Diabetic diet and Low fat, Low cholesterol diet  DISCHARGE CONDITION:  Stable  ACTIVITY:  Activity as tolerated  OXYGEN:  Home Oxygen: No.   Oxygen Delivery: room air  DISCHARGE LOCATION:  home   If you experience worsening of your admission symptoms, develop shortness of breath, life threatening emergency, suicidal or homicidal thoughts you must seek medical attention immediately by calling 911 or calling your MD immediately  if symptoms less severe.  You Must read complete instructions/literature along with all the possible adverse reactions/side effects for all the Medicines you take and that have been prescribed to you. Take any new Medicines after you have completely understood and accpet all the possible adverse reactions/side effects.   Please note  You were cared for by a hospitalist during your hospital stay. If you have any questions about your discharge medications or the care you received while you were in the hospital after you are discharged, you can call the unit and asked to speak with the hospitalist on call if the hospitalist that took care of you is not available. Once you are discharged, your primary care physician will handle any further medical issues. Please note that NO REFILLS for any discharge medications will be authorized once you are discharged, as it is imperative that you return to your primary care physician (or establish a relationship with a primary care physician if you do not have one) for your aftercare needs so that they can reassess your need for medications and monitor your lab values.    Transient Ischemic Attack A transient ischemic attack (TIA) is a "warning stroke" that causes stroke-like symptoms. Unlike a stroke, a TIA does not cause permanent damage to the brain. The symptoms of a TIA can happen very fast and do not last long. It is important to know the symptoms of a TIA and what to do. This can help prevent a major stroke or  death. CAUSES   A TIA is caused by a temporary blockage in an artery in the brain or neck (carotid artery). The blockage does not allow the brain to get the blood supply it needs and can cause different symptoms. The blockage can be caused by either:  A blood clot.  Fatty buildup (plaque) in a neck or brain artery. RISK FACTORS  High blood pressure (hypertension).  High cholesterol.  Diabetes mellitus.  Heart disease.  The build up of plaque in the blood vessels (peripheral artery disease or atherosclerosis).  The build up of plaque in the blood vessels providing blood and oxygen to the brain (carotid artery stenosis).  An abnormal heart rhythm (atrial fibrillation).  Obesity.  Smoking.  Taking oral contraceptives (especially in combination with smoking).  Physical inactivity.  A diet high in fats, salt (sodium), and calories.  Alcohol use.  Use of illegal drugs (especially cocaine and methamphetamine).  Being male.  Being African American.  Being over the age of 27.  Family history of stroke.  Previous history of blood clots, stroke, TIA, or heart attack.  Sickle cell disease. SYMPTOMS  TIA symptoms are the same as a stroke but are temporary. These symptoms usually develop suddenly, or may be newly present upon awakening from sleep:  Sudden weakness or numbness of the face, arm, or leg, especially on one side of the body.  Sudden trouble walking or difficulty moving arms or legs.  Sudden confusion.  Sudden personality changes.  Trouble speaking (aphasia) or understanding.  Difficulty swallowing.  Sudden trouble seeing in one  or both eyes.  Double vision.  Dizziness.  Loss of balance or coordination.  Sudden severe headache with no known cause.  Trouble reading or writing.  Loss of bowel or bladder control.  Loss of consciousness. DIAGNOSIS  Your caregiver may be able to determine the presence or absence of a TIA based on your symptoms,  history, and physical exam. Computed tomography (CT scan) of the brain is usually performed to help identify a TIA. Other tests may be done to diagnose a TIA. These tests may include:  Electrocardiography.  Continuous heart monitoring.  Echocardiography.  Carotid ultrasonography.  Magnetic resonance imaging (MRI).  A scan of the brain circulation.  Blood tests. PREVENTION  The risk of a TIA can be decreased by appropriately treating high blood pressure, high cholesterol, diabetes, heart disease, and obesity and by quitting smoking, limiting alcohol, and staying physically active. TREATMENT  Time is of the essence. Since the symptoms of TIA are the same as a stroke, it is important to seek treatment as soon as possible because you may need a medicine to dissolve the clot (thrombolytic) that cannot be given if too much time has passed. Treatment options vary. Treatment options may include rest, oxygen, intravenous (IV) fluids, and medicines to thin the blood (anticoagulants). Medicines and diet may be used to address diabetes, high blood pressure, and other risk factors. Measures will be taken to prevent short-term and long-term complications, including infection from breathing foreign material into the lungs (aspiration pneumonia), blood clots in the legs, and falls. Treatment options include procedures to either remove plaque in the carotid arteries or dilate carotid arteries that have narrowed due to plaque. Those procedures are:  Carotid endarterectomy.  Carotid angioplasty and stenting. HOME CARE INSTRUCTIONS   Take all medicines prescribed by your caregiver. Follow the directions carefully. Medicines may be used to control risk factors for a stroke. Be sure you understand all your medicine instructions.  You may be told to take aspirin or the anticoagulant warfarin. Warfarin needs to be taken exactly as instructed.  Taking too much or too little warfarin is dangerous. Too much  warfarin increases the risk of bleeding. Too little warfarin continues to allow the risk for blood clots. While taking warfarin, you will need to have regular blood tests to measure your blood clotting time. A PT blood test measures how long it takes for blood to clot. Your PT is used to calculate another value called an INR. Your PT and INR help your caregiver to adjust your dose of warfarin. The dose can change for many reasons. It is critically important that you take warfarin exactly as prescribed.  Many foods, especially foods high in vitamin K can interfere with warfarin and affect the PT and INR. Foods high in vitamin K include spinach, kale, broccoli, cabbage, collard and turnip greens, brussels sprouts, peas, cauliflower, seaweed, and parsley as well as beef and pork liver, green tea, and soybean oil. You should eat a consistent amount of foods high in vitamin K. Avoid major changes in your diet, or notify your caregiver before changing your diet. Arrange a visit with a dietitian to answer your questions.  Many medicines can interfere with warfarin and affect the PT and INR. You must tell your caregiver about any and all medicines you take, this includes all vitamins and supplements. Be especially cautious with aspirin and anti-inflammatory medicines. Do not take or discontinue any prescribed or over-the-counter medicine except on the advice of your caregiver or pharmacist.  Warfarin  can have side effects, such as excessive bruising or bleeding. You will need to hold pressure over cuts for longer than usual. Your caregiver or pharmacist will discuss other potential side effects.  Avoid sports or activities that may cause injury or bleeding.  Be mindful when shaving, flossing your teeth, or handling sharp objects.  Alcohol can change the body's ability to handle warfarin. It is best to avoid alcoholic drinks or consume only very small amounts while taking warfarin. Notify your caregiver if you  change your alcohol intake.  Notify your dentist or other caregivers before procedures.  Eat a diet that includes 5 or more servings of fruits and vegetables each day. This may reduce the risk of stroke. Certain diets may be prescribed to address high blood pressure, high cholesterol, diabetes, or obesity.  A low-sodium, low-saturated fat, low-trans fat, low-cholesterol diet is recommended to manage high blood pressure.  A low-saturated fat, low-trans fat, low-cholesterol, and high-fiber diet may control cholesterol levels.  A controlled-carbohydrate, controlled-sugar diet is recommended to manage diabetes.  A reduced-calorie, low-sodium, low-saturated fat, low-trans fat, low-cholesterol diet is recommended to manage obesity.  Maintain a healthy weight.  Stay physically active. It is recommended that you get at least 30 minutes of activity on most or all days.  Do not smoke.  Limit alcohol use even if you are not taking warfarin. Moderate alcohol use is considered to be:  No more than 2 drinks each day for men.  No more than 1 drink each day for nonpregnant women.  Stop drug abuse.  Home safety. A safe home environment is important to reduce the risk of falls. Your caregiver may arrange for specialists to evaluate your home. Having grab bars in the bedroom and bathroom is often important. Your caregiver may arrange for equipment to be used at home, such as raised toilets and a seat for the shower.  Follow all instructions for follow-up with your caregiver. This is very important. This includes any referrals and lab tests. Proper follow up can prevent a stroke or another TIA from occurring. SEEK MEDICAL CARE IF:  You have personality changes.  You have difficulty swallowing.  You are seeing double.  You have dizziness.  You have a fever.  You have skin breakdown. SEEK IMMEDIATE MEDICAL CARE IF:  Any of these symptoms may represent a serious problem that is an emergency. Do  not wait to see if the symptoms will go away. Get medical help right away. Call your local emergency services (911 in [place] Do not drive yourself to the hospital.  You have sudden weakness or numbness of the face, arm, or leg, especially on one side of the body.  You have sudden trouble walking or difficulty moving arms or legs.  You have sudden confusion.  You have trouble speaking (aphasia) or understanding.  You have sudden trouble seeing in one or both eyes.  You have a loss of balance or coordination.  You have a sudden, severe headache with no known cause.  You have new chest pain or an irregular heartbeat.  You have a partial or total loss of consciousness. MAKE SURE YOU:   Understand these instructions.  Will watch your condition.  Will get help right away if you are not doing well or get worse. Document Released: 05/23/2005 Document Revised: 08/18/2013 Document Reviewed: 11/18/2013 [facility] Patient Information 2015 [place], [place]. This information is not intended to replace advice given to you by your health care provider. Make sure you discuss any questions  you have with your health care provider.  Stroke Prevention Some medical conditions and behaviors are associated with an increased chance of having a stroke. You may prevent a stroke by making healthy choices and managing medical conditions. HOW CAN I REDUCE MY RISK OF HAVING A STROKE?   Stay physically active. Get at least 30 minutes of activity on most or all days.  Do not smoke. It may also be helpful to avoid exposure to secondhand smoke.  Limit alcohol use. Moderate alcohol use is considered to be:  No more than 2 drinks per day for men.  No more than 1 drink per day for nonpregnant women.  Eat healthy foods. This involves:  Eating 5 or more servings of fruits and vegetables a day.  Making dietary changes that address high blood pressure (hypertension), high cholesterol, diabetes, or  obesity.  Manage your cholesterol levels.  Making food choices that are high in fiber and low in saturated fat, trans fat, and cholesterol may control cholesterol levels.  Take any prescribed medicines to control cholesterol as directed by your health care provider.  Manage your diabetes.  Controlling your carbohydrate and sugar intake is recommended to manage diabetes.  Take any prescribed medicines to control diabetes as directed by your health care provider.  Control your hypertension.  Making food choices that are low in salt (sodium), saturated fat, trans fat, and cholesterol is recommended to manage hypertension.  Take any prescribed medicines to control hypertension as directed by your health care provider.  Maintain a healthy weight.  Reducing calorie intake and making food choices that are low in sodium, saturated fat, trans fat, and cholesterol are recommended to manage weight.  Stop drug abuse.  Avoid taking birth control pills.  Talk to your health care provider about the risks of taking birth control pills if you are over 44 years old, smoke, get migraines, or have ever had a blood clot.  Get evaluated for sleep disorders (sleep apnea).  Talk to your health care provider about getting a sleep evaluation if you snore a lot or have excessive sleepiness.  Take medicines only as directed by your health care provider.  For some people, aspirin or blood thinners (anticoagulants) are helpful in reducing the risk of forming abnormal blood clots that can lead to stroke. If you have the irregular heart rhythm of atrial fibrillation, you should be on a blood thinner unless there is a good reason you cannot take them.  Understand all your medicine instructions.  Make sure that other conditions (such as anemia or atherosclerosis) are addressed. SEEK IMMEDIATE MEDICAL CARE IF:   You have sudden weakness or numbness of the face, arm, or leg, especially on one side of the  body.  Your face or eyelid droops to one side.  You have sudden confusion.  You have trouble speaking (aphasia) or understanding.  You have sudden trouble seeing in one or both eyes.  You have sudden trouble walking.  You have dizziness.  You have a loss of balance or coordination.  You have a sudden, severe headache with no known cause.  You have new chest pain or an irregular heartbeat. Any of these symptoms may represent a serious problem that is an emergency. Do not wait to see if the symptoms will go away. Get medical help at once. Call your local emergency services (911 in [place] Do not drive yourself to the hospital. Document Released: 09/20/2004 Document Revised: 12/28/2013 Document Reviewed: 02/13/2013 [facility] Patient Information 2015 [place], [place].  This information is not intended to replace advice given to you by your health care provider. Make sure you discuss any questions you have with your health care provider. ° °

## 2015-05-18 ENCOUNTER — Encounter: Payer: Commercial Managed Care - PPO | Admitting: Neurology

## 2015-05-18 ENCOUNTER — Telehealth: Payer: Self-pay | Admitting: Neurology

## 2015-05-18 NOTE — Telephone Encounter (Signed)
The patient states he was seen at [facility] yesterday with a possibleTIA and was told to f/u with his neurologist. The patient was also seen at [facility] on 9-18 because he could not speak. The patient had an MRI and it showed the patient had a stroke about a month ago. The patient was somewhat confused but stated he should probably be seen this week. Please call and discuss. Thank you.

## 2015-05-18 NOTE — Telephone Encounter (Signed)
Rn call patient back to schedule appt with Dr.Sethi. Patients appt in November was cancel. Pt stated he was at [facility] for possible TIA, and he had slurred speech. Pt stated he feels better. Patient agreed to the appt on 05-23-15 at 0300am. Nurse told patient to arrive at [facility] for check in.

## 2015-05-23 ENCOUNTER — Encounter: Payer: Self-pay | Admitting: Neurology

## 2015-05-23 ENCOUNTER — Ambulatory Visit (INDEPENDENT_AMBULATORY_CARE_PROVIDER_SITE_OTHER): Payer: Commercial Managed Care - PPO | Admitting: Neurology

## 2015-05-23 VITALS — BP 156/96 | HR 82 | Ht 67.0 in | Wt 228.0 lb

## 2015-05-23 DIAGNOSIS — I639 Cerebral infarction, unspecified: Secondary | ICD-10-CM

## 2015-05-23 DIAGNOSIS — I6340 Cerebral infarction due to embolism of unspecified cerebral artery: Secondary | ICD-10-CM

## 2015-05-23 DIAGNOSIS — I672 Cerebral atherosclerosis: Secondary | ICD-10-CM | POA: Insufficient documentation

## 2015-05-23 NOTE — Patient Instructions (Signed)
I had a long discussion with the patient with regarding his symptoms of numbness and weakness as well as personally reviewed the imaging for lemons as well as hospital records from recent hospitalization at [facility] for stroke.I had a long d/w patient about his recent stroke, risk for recurrent stroke/TIAs, personally independently reviewed imaging studies and stroke evaluation results and answered questions.Continue aspirin 81 mg orally every day and clopidogrel 75 mg orally every day  for secondary stroke prevention in 2-3 months and then Plavix alone given intracranial atherosclerosis and maintain strict control of hypertension with blood pressure goal below 130/90, diabetes with hemoglobin A1c goal below 6.5% and lipids with LDL cholesterol goal below 100 mg/dL. I also advised the patient to eat a healthy diet with plenty of whole grains, cereals, fruits and vegetables, exercise regularly and maintain ideal body weight Followup in the future with me in 6 months or call earlier if necessary .Stroke Prevention Some medical conditions and behaviors are associated with an increased chance of having a stroke. You may prevent a stroke by making healthy choices and managing medical conditions. HOW CAN I REDUCE MY RISK OF HAVING A STROKE?   Stay physically active. Get at least 30 minutes of activity on most or all days.  Do not smoke. It may also be helpful to avoid exposure to secondhand smoke.  Limit alcohol use. Moderate alcohol use is considered to be:  No more than 2 drinks per day for men.  No more than 1 drink per day for nonpregnant women.  Eat healthy foods. This involves:  Eating 5 or more servings of fruits and vegetables a day.  Making dietary changes that address high blood pressure (hypertension), high cholesterol, diabetes, or obesity.  Manage your cholesterol levels.  Making food choices that are high in fiber and low in saturated fat, trans fat, and  cholesterol may control cholesterol levels.  Take any prescribed medicines to control cholesterol as directed by your health care provider.  Manage your diabetes.  Controlling your carbohydrate and sugar intake is recommended to manage diabetes.  Take any prescribed medicines to control diabetes as directed by your health care provider.  Control your hypertension.  Making food choices that are low in salt (sodium), saturated fat, trans fat, and cholesterol is recommended to manage hypertension.  Take any prescribed medicines to control hypertension as directed by your health care provider.  Maintain a healthy weight.  Reducing calorie intake and making food choices that are low in sodium, saturated fat, trans fat, and cholesterol are recommended to manage weight.  Stop drug abuse.  Avoid taking birth control pills.  Talk to your health care provider about the risks of taking birth control pills if you are over 59 years old, smoke, get migraines, or have ever had a blood clot.  Get evaluated for sleep disorders (sleep apnea).  Talk to your health care provider about getting a sleep evaluation if you snore a lot or have excessive sleepiness.  Take medicines only as directed by your health care provider.  For some people, aspirin or blood thinners (anticoagulants) are helpful in reducing the risk of forming abnormal blood clots that can lead to stroke. If you have the irregular heart rhythm of atrial fibrillation, you should be on a blood thinner unless there is a good reason you cannot take them.  Understand all your medicine instructions.  Make sure that other conditions (such as anemia or atherosclerosis) are addressed. SEEK IMMEDIATE MEDICAL CARE IF:  You have sudden weakness or numbness of the face, arm, or leg, especially on one side of the body.  Your face or eyelid droops to one side.  You have sudden confusion.  You have trouble speaking (aphasia) or  understanding.  You have sudden trouble seeing in one or both eyes.  You have sudden trouble walking.  You have dizziness.  You have a loss of balance or coordination.  You have a sudden, severe headache with no known cause.  You have new chest pain or an irregular heartbeat. Any of these symptoms may represent a serious problem that is an emergency. Do not wait to see if the symptoms will go away. Get medical help at once. Call your local emergency services (911 in [place] Do not drive yourself to the hospital. Document Released: 09/20/2004 Document Revised: 12/28/2013 Document Reviewed: 02/13/2013 [facility] Patient Information 2015 [place], [place]. This information is not intended to replace advice given to you by your health care provider. Make sure you discuss any questions you have with your health care provider.

## 2015-05-23 NOTE — Progress Notes (Signed)
[facility] [address] [place]. [place] [id number] [phone]       OFFICE FOLLOW UP NOTE  Roger. Roger [name] Date of Birth:  08-15-1966 Medical Record Number:  [id number]   Referring MD:   Dario GuardianJadali Reason for Referral:  Tingling and gait ataxia HPI:  Initial Consult 04/25/2015 : Roger [name] is a 49 year old male whose had for the last 3 weeks subacute progressive paresthesias mostly in the left hand but to lesser degree in the right hand as well. He states this began about 3 weeks ago when he noticed weakness on his whole left side not just his hand this lasted only 10 minutes and recovered he subsequently had tickling sensation in his left hand and to lesser extent the right hand. Next a noticed a little heaviness on the right side and his right leg was buckling. He was seen in the emergency room at [facility] on 04/05/2015 and had unremarkable exam. Basic metabolic panel labs and CBC unremarkable. CT scan of the head which have personally reviewed was normal. He was subsequently seen by his primary physician and underwent a bunch of tests including carotid ultrasound, transthoracic echo and nuclear stress test all of which were normal. I do not have the results to review today. He had lab work done on 04/05/15 growing vitamin B12 which came slightly low-normal at 209. Patient has noticed for the last several days that he stumbles a lot. His gait is off balance. At times he can even drop objects from his left hand is he has so much tingling. This tingling seems to be more pronounced at night and at times he cannot sleep because of this. 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 He does have long-standing history of diabetes in which was poorly controlled in the past with lasted about an A1c being 7.9 but after being started on insulin he is doing better and more recently his fasting sugars have ranged from 90-120. He does  have history of mild tingling in his feet but he does not feel that this has gotten worse. He has not tried any specific medications for paresthesias in his hand. He denies history of any head injury, headache, neck pain, radicular pain. He does admit to significant fatigue over the last several months but denies any loss of vision, vertigo, diplopia, bladder urgency or incontinence. Update 05/23/2015 : He returns for follow-up after last visit 4 weeks ago. He was admitted to [facility] on 05/13/2015  with increased left hand weakness and numbness and 2 weeks ago had previous episode of transient speech difficulties. He was seen in the ER and that time and tone.Marland Kitchen. MRI scan showed a right parietal deep white matter 2-3 cm subacute infarct extending into the posterior frontal lobe. 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 He was started on aspirin and Plavix together carotid ultrasound and transthoracic echo no source of embolism identified. He had outpatient cardiac monitor as well as transesophageal echocardiogram both of which did not reveal atrial fibrillation no cardiac source of embolism. Lipid panel and hemoglobin 1107 normal. He was seen by physical occupational speech therapy. He states his done well since discharge is tolerating aspirin and Plavix with minor bruising but no bleeding episodes. He is exercising and watching his diet has lost 15 pounds. He has also been getting B12 shots and feels it has overall  that his tingling numbness as well as is walking better. ROS:   14 system review of systems is positive for  dizziness, weakness, apnea and all other systems negative  PMH:  Past Medical History  Diagnosis Date  . Hypertension   . Diabetes mellitus without complication     insulin-dependent  . Hyperlipidemia   . Obesity   . OSA on CPAP   . RAD (reactive airway disease)   . B12 deficiency   . Stroke     Social History:    Social History   Social History  . Marital Status: Single    Spouse Name: N/A  . Number of Children: N/A  . Years of Education: N/A   Occupational History  . Not on file.   Social History Main Topics  . Smoking status: Never Smoker   . Smokeless tobacco: Not on file  . Alcohol Use: No  . Drug Use: No  . Sexual Activity: Not on file   Other Topics Concern  . Not on file   Social History Narrative    Medications:   Current Outpatient Prescriptions on File Prior to Visit  Medication Sig Dispense Refill  . albuterol (PROVENTIL HFA;VENTOLIN HFA) 108 (90 BASE) MCG/ACT inhaler Inhale 2 puffs into the lungs every 6 (six) hours as needed for wheezing or shortness of breath.    Marland Kitchen aspirin EC 81 MG tablet Take 81 mg by mouth daily.    . clopidogrel (PLAVIX) 75 MG tablet Take 1 tablet (75 mg total) by mouth daily. 30 tablet 1  . Exenatide ER (BYDUREON) 2 MG SRER Inject 2 mg into the skin once a week. Pt uses on Sunday.    . furosemide (LASIX) 20 MG tablet Take 40 mg by mouth daily.     . insulin glargine (LANTUS) 100 UNIT/ML injection Inject 20-25 Units into the skin 2 (two) times daily. Pt uses 25 units in the morning and 20 units at bedtime.    Marland Kitchen losartan (COZAAR) 100 MG tablet Take 100 mg by mouth daily.    . metFORMIN (GLUCOPHAGE) 1000 MG tablet Take 1 tablet (1,000 mg total) by mouth 2 (two) times daily with a meal. 60 tablet 0  . metoprolol succinate (TOPROL-XL) 50 MG 24 hr tablet Take 75 mg by mouth daily.     . niacin (NIASPAN) 500 MG CR tablet Take 1,500 mg by mouth at bedtime.    . simvastatin (ZOCOR) 20 MG tablet Take 20 mg by mouth at bedtime.    . topiramate (TOPAMAX) 50 MG tablet Take 2 tablets (100 mg total) by mouth 2 (two) times daily. 60 tablet 1   No current facility-administered medications on file prior to visit.    Allergies:  No Known Allergies  Physical Exam General: Obese young male seated, in no evident distress Head: head normocephalic and atraumatic.    Neck: supple with no carotid or supraclavicular bruits Cardiovascular: regular rate and rhythm, no murmurs Musculoskeletal: no deformity Skin:  no rash/petichiae Vascular:  Normal pulses all extremities  Neurologic Exam Mental Status: Awake and fully alert. Oriented to place and time. Recent and remote memory intact. Attention span, concentration and fund of knowledge appropriate. Mood and affect appropriate.  Cranial Nerves: Fundoscopic exam reveals sharp disc margins. Pupils equal, briskly reactive to light. Extraocular movements full without nystagmus. Visual fields full to confrontation. Hearing intact. Facial sensation intact. Face, tongue, palate moves normally and symmetrically.  Motor: Normal bulk and tone. Normal strength in all tested extremity muscles. Sensory.:  intact to touch , pinprick , position and vibratory sensation.  Coordination: Rapid alternating movements normal in all extremities. Finger-to-nose and heel-to-shin performed accurately bilaterally. Gait and Station: Arises from chair with slight difficulty. Stance is broad based. Gait demonstrates mild imbalance . Unable to heel, toe and tandem walk without difficulty.  Reflexes: 2+  brsik and symmetric. Right Plantar downgoing and left is equivocal.       ASSESSMENT: 50 year male with  history of progressive left hand paresthesias as well as more recently gait and balance difficulties with frequent falls due to right MCA branch infarct likely due to intracranial atherosclerosis History  of B12 deficiency  . Vascular risk factors of hypertension, diabetes, obesity, sleep apnea and intracranial atherosclerosis  PLAN:  I had a long discussion with the patient with regarding his symptoms of numbness and weakness as well as personally reviewed the imaging for lemons as well as hospital records from recent hospitalization at [facility] for stroke.I had a long d/w patient about his recent stroke, risk for  recurrent stroke/TIAs, personally independently reviewed imaging studies and stroke evaluation results and answered questions.Continue aspirin 81 mg orally every day and clopidogrel 75 mg orally every day  for secondary stroke prevention in 2-3 months and then Plavix alone given intracranial atherosclerosis and maintain strict control of hypertension with blood pressure goal below 130/90, diabetes with hemoglobin A1c goal below 6.5% and lipids with LDL cholesterol goal below 100 mg/dL He was counseled to continue CPAP for his sleep apnea.. I also advised the patient to eat a healthy diet with plenty of whole grains, cereals, fruits and vegetables, exercise regularly and maintain ideal body weight Followup in the future with me in 6 months or call earlier if necessary  Delia Heady, MD Note: This document was prepared with digital dictation and possible smart phrase technology. Any transcriptional errors that result from this process are unintentional.

## 2015-05-24 LAB — FACTOR 5 LEIDEN

## 2015-05-24 LAB — LUPUS ANTICOAGULANT PANEL
DRVVT: 31.7 s (ref 0.0–55.1)
PTT LA: 32 s (ref 0.0–50.0)

## 2015-05-24 LAB — BETA-2-GLYCOPROTEIN I ABS, IGG/M/A: Beta-2-Glycoprotein I IgA: <9 GPI IgA units (ref 0–25)

## 2015-05-24 LAB — PROTHROMBIN GENE MUTATION

## 2015-05-24 LAB — CARDIOLIPIN ANTIBODIES, IGG, IGM, IGA: Anticardiolipin IgM: <9 MPL U/mL (ref 0–12)

## 2015-05-24 LAB — PROTEIN C ACTIVITY: PROTEIN C ACTIVITY: 157 % — AB (ref 74–151)

## 2015-05-24 LAB — PROTEIN C, TOTAL: Protein C, Total: 134 % (ref 70–140)

## 2015-05-24 LAB — PROTEIN S, TOTAL: Protein S Ag, Total: 149 % (ref 58–150)

## 2015-05-24 LAB — PROTEIN S ACTIVITY: Protein S Activity: 132 % (ref 60–145)

## 2015-05-24 LAB — HOMOCYSTEINE: Homocysteine: 7.5 umol/L (ref 0.0–15.0)

## 2015-05-25 ENCOUNTER — Other Ambulatory Visit: Payer: Commercial Managed Care - PPO

## 2015-05-30 ENCOUNTER — Telehealth: Payer: Self-pay | Admitting: Neurology

## 2015-05-30 NOTE — Telephone Encounter (Signed)
Returned patients call. No answer.

## 2015-05-30 NOTE — Telephone Encounter (Signed)
Patient called, states that when he goes from sitting to standing and when he gets out of the car he has dizziness, gets a weird feeling. Has had 2 previous strokes. Not sure if this is normal or not.

## 2015-05-31 ENCOUNTER — Telehealth: Payer: Self-pay | Admitting: Neurology

## 2015-05-31 DIAGNOSIS — R202 Paresthesia of skin: Secondary | ICD-10-CM

## 2015-05-31 DIAGNOSIS — G5602 Carpal tunnel syndrome, left upper limb: Secondary | ICD-10-CM

## 2015-05-31 MED ORDER — TOPIRAMATE 50 MG PO TABS: 100.0000 mg | ORAL_TABLET | Freq: Two times a day (BID) | ORAL | Status: DC

## 2015-05-31 NOTE — Telephone Encounter (Signed)
Rx sent with same instruction as prescribed at [facility].  Receipt confirmed by pharmacy.

## 2015-05-31 NOTE — Telephone Encounter (Signed)
I called the patient's listed home number and left message on his voicemail to call me back

## 2015-05-31 NOTE — Telephone Encounter (Signed)
Rn call patients number and left vm to return phone call.

## 2015-05-31 NOTE — Telephone Encounter (Signed)
Julie/[facility]/[place] [phone] has been trying to fax refill request with no success since Sunday for topiramate (TOPAMAX) 50 MG tablet

## 2015-05-31 NOTE — Telephone Encounter (Signed)
Patient returned your call.

## 2015-06-01 ENCOUNTER — Ambulatory Visit (INDEPENDENT_AMBULATORY_CARE_PROVIDER_SITE_OTHER): Payer: Commercial Managed Care - PPO

## 2015-06-01 DIAGNOSIS — I639 Cerebral infarction, unspecified: Secondary | ICD-10-CM | POA: Diagnosis not present

## 2015-06-01 NOTE — Telephone Encounter (Signed)
I called the patient yesterday as well as today but was unable to reach him. I left a message on his answering machine to call me back if is still needed me

## 2015-06-06 ENCOUNTER — Telehealth: Payer: Self-pay | Admitting: Neurology

## 2015-06-06 NOTE — Telephone Encounter (Signed)
I called the patient and discuss with him his symptoms of transient dizziness which seem to be orthostatic dizziness which likely  related to drop in blood pressure. I advised him to do orthostatic tolerance exercises and to get up slowly.   I also discussed results of transcranial Doppler study done in the office showing elevated right middle cerebral artery velocities worrisome for moderate stenosis but this seems surprising given the fact that both MRA of the brain and CT angiogram done on 05/14/15 showed no significant stenosis. This may represent collateral flow given his left vertebral artery occlusion as well as diffuse intracranial atherosclerotic disease. He was advised to stay on aspirin and Plavix for stroke prevention as well as maintain excellent risk factor control of diabetes with hemoglobin A1c goal below 6.5, lipids with LDL cholesterol goal below 70 mg percent and hypertension with blood pressure goal below 140/90. He was advised to call if he had new persistent focal neurological symptoms. He voiced understanding.

## 2015-07-04 ENCOUNTER — Ambulatory Visit: Payer: Commercial Managed Care - PPO | Admitting: Neurology

## 2015-11-21 ENCOUNTER — Encounter: Payer: Self-pay | Admitting: Neurology

## 2015-11-21 ENCOUNTER — Ambulatory Visit (INDEPENDENT_AMBULATORY_CARE_PROVIDER_SITE_OTHER): Payer: Commercial Managed Care - PPO | Admitting: Neurology

## 2015-11-21 VITALS — BP 139/91 | HR 100 | Ht 67.0 in | Wt 217.2 lb

## 2015-11-21 DIAGNOSIS — I672 Cerebral atherosclerosis: Secondary | ICD-10-CM | POA: Diagnosis not present

## 2015-11-21 NOTE — Patient Instructions (Signed)
I had a long d/w patient and his wife about his recent stroke, risk for recurrent stroke/TIAs, personally independently reviewed imaging studies and stroke evaluation results and answered questions.Continue Plavix but stop aspirin for secondary stroke prevention as it has been more than 6 months of dual antiplatelet therapy and maintain strict control of hypertension with blood pressure goal below 130/90, diabetes with hemoglobin A1c goal below 6.5% and lipids with LDL cholesterol goal below 70 mg/dL. I also encouraged him to wear his CPAP mask every night regularly for his sleep apnea I also advised the patient to eat a healthy diet with plenty of whole grains, cereals, fruits and vegetables, exercise regularly and maintain ideal body weight .check follow-up carotid ultrasound and transcranial Doppler studies Followup in the future with Darrol Angelarolyn Martin, NP in 6 months or call earlier if necessary.

## 2015-11-21 NOTE — Progress Notes (Signed)
[facility] [address] [place]. [place] [id number] [phone]       OFFICE FOLLOW UP NOTE  Roger. Roger [name] Date of Birth:  08-15-1966 Medical Record Number:  [id number]   Referring MD:   Dario GuardianJadali Reason for Referral:  Tingling and gait ataxia HPI:  Initial Consult 04/25/2015 : Roger [name] is a 50 year old male whose had for the last 3 weeks subacute progressive paresthesias mostly in the left hand but to lesser degree in the right hand as well. He states this began about 3 weeks ago when he noticed weakness on his whole left side not just his hand this lasted only 10 minutes and recovered he subsequently had tickling sensation in his left hand and to lesser extent the right hand. Next a noticed a little heaviness on the right side and his right leg was buckling. He was seen in the emergency room at [facility] on 04/05/2015 and had unremarkable exam. Basic metabolic panel labs and CBC unremarkable. CT scan of the head which have personally reviewed was normal. He was subsequently seen by his primary physician and underwent a bunch of tests including carotid ultrasound, transthoracic echo and nuclear stress test all of which were normal. I do not have the results to review today. He had lab work done on 04/05/15 growing vitamin B12 which came slightly low-normal at 209. Patient has noticed for the last several days that he stumbles a lot. His gait is off balance. At times he can even drop objects from his left hand is he has so much tingling. This tingling seems to be more pronounced at night and at times he cannot sleep because of this. 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 He does have long-standing history of diabetes in which was poorly controlled in the past with lasted about an A1c being 7.9 but after being started on insulin he is doing better and more recently his fasting sugars have ranged from 90-120. He does  have history of mild tingling in his feet but he does not feel that this has gotten worse. He has not tried any specific medications for paresthesias in his hand. He denies history of any head injury, headache, neck pain, radicular pain. He does admit to significant fatigue over the last several months but denies any loss of vision, vertigo, diplopia, bladder urgency or incontinence. Update 05/23/2015 : He returns for follow-up after last visit 4 weeks ago. He was admitted to [facility] on 05/13/2015  with increased left hand weakness and numbness and 2 weeks ago had previous episode of transient speech difficulties. He was seen in the ER and that time and tone.Marland Kitchen. MRI scan showed a right parietal deep white matter 2-3 cm subacute infarct extending into the posterior frontal lobe. 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 He was started on aspirin and Plavix together carotid ultrasound and transthoracic echo no source of embolism identified. He had outpatient cardiac monitor as well as transesophageal echocardiogram both of which did not reveal atrial fibrillation no cardiac source of embolism. Lipid panel and hemoglobin 1107 normal. He was seen by physical occupational speech therapy. He states his done well since discharge is tolerating aspirin and Plavix with minor bruising but no bleeding episodes. He is exercising and watching his diet has lost 15 pounds. He has also been getting B12 shots and feels it has overall  that his tingling numbness as well as is walking better. Update 11/21/2015 : He returns for follow-up after last visit 6 months ago. He continues to do well without recurrent stroke as he TIA symptoms. He states his blood pressure is well controlled at home and today it is 139/91. His fasting sugars have all been in the low 100 range and last him about an A1c was 7.1 a few months ago. He states his last lipid profile also was  satisfactory. He does use CPAP at most nights but not consistently. He has not been as active as he would like to be and would like tighter diet control and weight loss. He still remains on aspirin and Plavix and is tolerating it well without significant bruising or bleeding. He has no new complaints. ROS:   14 system review of systems is positive for no complaints except those documented in history of present illness and all other systems negative  PMH:  Past Medical History  Diagnosis Date  . Hypertension   . Diabetes mellitus without complication (HCC)     insulin-dependent  . Hyperlipidemia   . Obesity   . OSA on CPAP   . RAD (reactive airway disease)   . B12 deficiency   . Stroke [facility])     Social History:  Social History   Social History  . Marital Status: Single    Spouse Name: N/A  . Number of Children: N/A  . Years of Education: N/A   Occupational History  . Not on file.   Social History Main Topics  . Smoking status: Never Smoker   . Smokeless tobacco: Not on file  . Alcohol Use: No  . Drug Use: No  . Sexual Activity: Not on file   Other Topics Concern  . Not on file   Social History Narrative    Medications:   Current Outpatient Prescriptions on File Prior to Visit  Medication Sig Dispense Refill  . albuterol (PROVENTIL HFA;VENTOLIN HFA) 108 (90 BASE) MCG/ACT inhaler Inhale 2 puffs into the lungs every 6 (six) hours as needed for wheezing or shortness of breath.    Marland Kitchen amLODipine (NORVASC) 5 MG tablet Take 10 mg by mouth.    Marland Kitchen aspirin EC 81 MG tablet Take 81 mg by mouth daily.    . clopidogrel (PLAVIX) 75 MG tablet Take 1 tablet (75 mg total) by mouth daily. 30 tablet 1  . Exenatide ER (BYDUREON) 2 MG SRER Inject 2 mg into the skin once a week. Pt uses on Sunday.    . furosemide (LASIX) 20 MG tablet Take 40 mg by mouth daily.     Marland Kitchen glucose blood (BAYER CONTOUR NEXT TEST) test strip use three times a day    . insulin glargine (LANTUS) 100 UNIT/ML injection  Inject 20-25 Units into the skin 2 (two) times daily. Pt uses 25 units in the morning and 20 units at bedtime.    Marland Kitchen losartan (COZAAR) 100 MG tablet Take 100 mg by mouth daily.    . metFORMIN (GLUCOPHAGE) 1000 MG tablet Take 1 tablet (1,000 mg total) by mouth 2 (two) times daily with a meal. 60 tablet 0  . metoprolol succinate (TOPROL-XL) 50 MG 24 hr tablet Take 75 mg by mouth daily.     Marland Kitchen NEEDLE, DISP, 27 G 27G X 1/2" MISC Use with B12 injection    . niacin (NIASPAN) 500 MG CR tablet Take 1,500 mg by mouth at bedtime.    Letta Pate DELICA LANCETS FINE MISC  Inject into the skin.    Marland Kitchen simvastatin (ZOCOR) 20 MG tablet Take 20 mg by mouth at bedtime.    . SYRINGE-NEEDLE, DISP, 3 ML (BD ECLIPSE SYRINGE) 25G X 1" 3 ML MISC Use with B12 injection    . topiramate (TOPAMAX) 50 MG tablet Take 2 tablets (100 mg total) by mouth 2 (two) times daily. 60 tablet 1   No current facility-administered medications on file prior to visit.    Allergies:  No Known Allergies  Physical Exam General: Obese young male seated, in no evident distress Head: head normocephalic and atraumatic.   Neck: supple with no carotid or supraclavicular bruits Cardiovascular: regular rate and rhythm, no murmurs Musculoskeletal: no deformity Skin:  no rash/petichiae Vascular:  Normal pulses all extremities  Neurologic Exam Mental Status: Awake and fully alert. Oriented to place and time. Recent and remote memory intact. Attention span, concentration and fund of knowledge appropriate. Mood and affect appropriate.  Cranial Nerves: Fundoscopic exam not done  . Pupils equal, briskly reactive to light. Extraocular movements full without nystagmus. Visual fields full to confrontation. Hearing intact. Facial sensation intact. Face, tongue, palate moves normally and symmetrically.  Motor: Normal bulk and tone. Normal strength in all tested extremity muscles. Sensory.: intact to touch , pinprick , position and vibratory sensation.    Coordination: Rapid alternating movements normal in all extremities. Finger-to-nose and heel-to-shin performed accurately bilaterally. Gait and Station: Arises from chair with slight difficulty. Stance is broad based. Gait demonstrates mild imbalance . Unable to heel, toe and tandem walk without difficulty.  Reflexes: 2+  brsik and symmetric. Right Plantar downgoing and left is equivocal.       ASSESSMENT: 50 year male with  history of progressive left hand paresthesias as well as more recently gait and balance difficulties with frequent falls due to right MCA branch infarct likely due to intracranial atherosclerosis History  of B12 deficiency  . Vascular risk factors of hypertension, diabetes, obesity, sleep apnea and intracranial atherosclerosis  PLAN:  I had a long d/w patient and his wife about his recent stroke, risk for recurrent stroke/TIAs, personally independently reviewed imaging studies and stroke evaluation results and answered questions.Continue Plavix but stop aspirin for secondary stroke prevention as it has been more than 6 months of dual antiplatelet therapy and maintain strict control of hypertension with blood pressure goal below 130/90, diabetes with hemoglobin A1c goal below 6.5% and lipids with LDL cholesterol goal below 70 mg/dL. I also encouraged him to wear his CPAP mask every night regularly for his sleep apnea I also advised the patient to eat a healthy diet with plenty of whole grains, cereals, fruits and vegetables, exercise regularly and maintain ideal body weight .check follow-up carotid ultrasound and transcranial Doppler studies Followup in the future with Darrol Angel, NP in 6 months or call earlier if necessary.    Delia Heady, MD Note: This document was prepared with digital dictation and possible smart phrase technology. Any transcriptional errors that result from this process are unintentional.

## 2016-02-22 ENCOUNTER — Ambulatory Visit (INDEPENDENT_AMBULATORY_CARE_PROVIDER_SITE_OTHER): Payer: Commercial Managed Care - PPO

## 2016-02-22 DIAGNOSIS — Z0289 Encounter for other administrative examinations: Secondary | ICD-10-CM

## 2016-02-22 DIAGNOSIS — I672 Cerebral atherosclerosis: Secondary | ICD-10-CM

## 2016-03-13 ENCOUNTER — Telehealth: Payer: Self-pay | Admitting: *Deleted

## 2016-03-13 NOTE — Telephone Encounter (Signed)
LVM requesting call back re: carotid US results. Left name, number.

## 2016-03-15 NOTE — Telephone Encounter (Signed)
Pt called in for results. Please call

## 2016-03-16 NOTE — Telephone Encounter (Signed)
RN call patient to notify him of the carotid ultrasound. Rn stated the test was unremarkable. Pt verbalized understanding.

## 2016-05-21 ENCOUNTER — Ambulatory Visit (INDEPENDENT_AMBULATORY_CARE_PROVIDER_SITE_OTHER): Payer: Commercial Managed Care - PPO | Admitting: Nurse Practitioner

## 2016-05-21 ENCOUNTER — Encounter: Payer: Self-pay | Admitting: Nurse Practitioner

## 2016-05-21 VITALS — BP 137/94 | HR 88 | Ht 67.0 in | Wt 224.8 lb

## 2016-05-21 DIAGNOSIS — I10 Essential (primary) hypertension: Secondary | ICD-10-CM | POA: Diagnosis not present

## 2016-05-21 DIAGNOSIS — I6310 Cerebral infarction due to embolism of unspecified precerebral artery: Secondary | ICD-10-CM

## 2016-05-21 DIAGNOSIS — G4733 Obstructive sleep apnea (adult) (pediatric): Secondary | ICD-10-CM

## 2016-05-21 DIAGNOSIS — G459 Transient cerebral ischemic attack, unspecified: Secondary | ICD-10-CM | POA: Diagnosis not present

## 2016-05-21 DIAGNOSIS — Z9989 Dependence on other enabling machines and devices: Secondary | ICD-10-CM

## 2016-05-21 MED ORDER — CLOPIDOGREL BISULFATE 75 MG PO TABS: 75.0000 mg | ORAL_TABLET | Freq: Every day | ORAL | 11 refills | Status: AC

## 2016-05-21 NOTE — Progress Notes (Signed)
[facility]  PATIENT: Roger [name] DOB: [birth year]   REASON FOR VISIT: Follow up for embolic stroke HISTORY FROM: Patient    HISTORY OF PRESENT ILLNESS: Initial Consult 04/25/2015 PS: Roger [name] is a 50 year old male whose had for the last 3 weeks subacute progressive paresthesias mostly in the left hand but to lesser degree in the right hand as well. He states this began about 3 weeks ago when he noticed weakness on his whole left side not just his hand this lasted only 10 minutes and recovered he subsequently had tickling sensation in his left hand and to lesser extent the right hand. Next a noticed a little heaviness on the right side and his right leg was buckling. He was seen in the emergency room at [facility] on 04/05/2015 and had unremarkable exam. Basic metabolic panel labs and CBC unremarkable. CT scan of the head which have personally reviewed was normal. He was subsequently seen by his primary physician and underwent a bunch of tests including carotid ultrasound, transthoracic echo and nuclear stress test all of which were normal. I do not have the results to review today. He had lab work done on 04/05/15 growing vitamin B12 which came slightly low-normal at 209. Patient has noticed for the last several days that he stumbles a lot. His gait is off balance. At times he can even drop objects from his left hand is he has so much tingling. This tingling seems to be more pronounced at night and at times he cannot sleep because of this. 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 He does have long-standing history of diabetes in which was poorly controlled in the past with lasted about an A1c being 7.9 but after being started on insulin he is doing better and more recently his fasting sugars have ranged from 90-120. He does have history of mild tingling in his feet but he does not feel that this has gotten worse. He  has not tried any specific medications for paresthesias in his hand. He denies history of any head injury, headache, neck pain, radicular pain. He does admit to significant fatigue over the last several months but denies any loss of vision, vertigo, diplopia, bladder urgency or incontinence. Update 05/23/2015 PS: He returns for follow-up after last visit 4 weeks ago. He was admitted to [facility] on 05/13/2015  with increased left hand weakness and numbness and 2 weeks ago had previous episode of transient speech difficulties. He was seen in the ER and that time and tone.Marland Kitchen MRI scan showed a right parietal deep white matter 2-3 cm subacute infarct extending into the posterior frontal lobe. 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 He was started on aspirin and Plavix together carotid ultrasound and transthoracic echo no source of embolism identified. He had outpatient cardiac monitor as well as transesophageal echocardiogram both of which did not reveal atrial fibrillation no cardiac source of embolism. Lipid panel and hemoglobin 1107 normal. He was seen by physical occupational speech therapy. He states his done well since discharge is tolerating aspirin and Plavix with minor bruising but no bleeding episodes. He is exercising and watching his diet has lost 15 pounds. He has also been getting B12 shots and feels it has overall that his tingling numbness as well as is walking better. Update 11/21/2015 PS: He returns for follow-up after last visit 6 months  ago. He continues to do well without recurrent stroke as he TIA symptoms. He states his blood pressure is well controlled at home and today it is 139/91. His fasting sugars have all been in the low 100 range and last him about an A1c was 7.1 a few months ago. He states his last lipid profile also was satisfactory. He does use CPAP at most nights but not consistently. He has not been as active as he  would like to be and would like tighter diet control and weight loss. He still remains on aspirin and Plavix and is tolerating it well without significant bruising or bleeding. He has no new complaints.  UPDATE 09/25/2017CM Roger [name], 50 year old returns for follow-up. He has a history of stroke event which occurred in September 2016. He is currently on Plavix for secondary stroke prevention without further stroke or TIA symptoms. No bruising noted. In addition he has obstructive sleep apnea and has not been using CPAP. This is followed by his primary Dr. Meredeth [name]. He was made aware that this is a risk factor for stroke. Blood pressure is 137/94 the office today. He claims his diabetes is in better control. I do not have any results He has stopped exercising for a while but is now back to walking twice a day. He has a sedentary job. He remains on Zocor for hyperlipidemia without complaints of myalgias. He returns for reevaluation without any neurologic complaints. Most recent carotid Doppler 02/22/2016 was normal. He returns for reevaluation    REVIEW OF SYSTEMS: Full 14 system review of systems performed and notable only for those listed, all others are neg:  Constitutional: neg  Cardiovascular: neg Ear/Nose/Throat: neg  Skin: neg Eyes: neg Respiratory: neg Gastroitestinal: neg  Hematology/Lymphatic: neg  Endocrine: neg Musculoskeletal:neg Allergy/Immunology: neg Neurological: neg Psychiatric: neg Sleep : neg   ALLERGIES: No Known Allergies  HOME MEDICATIONS: Outpatient Medications Prior to Visit  Medication Sig Dispense Refill  . albuterol (PROVENTIL HFA;VENTOLIN HFA) 108 (90 BASE) MCG/ACT inhaler Inhale 2 puffs into the lungs every 6 (six) hours as needed for wheezing or shortness of breath.    Marland Kitchen amLODipine (NORVASC) 5 MG tablet Take 10 mg by mouth.    . clopidogrel (PLAVIX) 75 MG tablet Take 1 tablet (75 mg total) by mouth daily. 30 tablet 1  . Exenatide ER (BYDUREON) 2 MG SRER  Inject 2 mg into the skin once a week. Pt uses on Sunday.    . furosemide (LASIX) 20 MG tablet Take 40 mg by mouth daily.     Marland Kitchen glucose blood (BAYER CONTOUR NEXT TEST) test strip use three times a day    . insulin glargine (LANTUS) 100 UNIT/ML injection Inject 20-25 Units into the skin 2 (two) times daily. Pt uses 25 units in the morning and 20 units at bedtime.    Marland Kitchen losartan (COZAAR) 100 MG tablet Take 100 mg by mouth daily.    . metFORMIN (GLUCOPHAGE) 1000 MG tablet Take 1 tablet (1,000 mg total) by mouth 2 (two) times daily with a meal. 60 tablet 0  . metoprolol succinate (TOPROL-XL) 50 MG 24 hr tablet Take 75 mg by mouth daily.     Marland Kitchen NEEDLE, DISP, 27 G 27G X 1/2" MISC Use with B12 injection    . niacin (NIASPAN) 500 MG CR tablet Take 1,500 mg by mouth at bedtime.    Letta Pate DELICA LANCETS FINE MISC Inject into the skin.    Marland Kitchen simvastatin (ZOCOR) 20 MG tablet Take 20 mg by  mouth at bedtime.    . SYRINGE-NEEDLE, DISP, 3 ML (BD ECLIPSE SYRINGE) 25G X 1" 3 ML MISC Use with B12 injection    . topiramate (TOPAMAX) 50 MG tablet Take 2 tablets (100 mg total) by mouth 2 (two) times daily. 60 tablet 1  . aspirin EC 81 MG tablet Take 81 mg by mouth daily.     No facility-administered medications prior to visit.     PAST MEDICAL HISTORY: Past Medical History:  Diagnosis Date  . B12 deficiency   . Diabetes mellitus without complication (HCC)    insulin-dependent  . Hyperlipidemia   . Hypertension   . Obesity   . OSA on CPAP   . RAD (reactive airway disease)   . Stroke [facility](HCC)     PAST SURGICAL HISTORY: Past Surgical History:  Procedure Laterality Date  . EYE SURGERY Right    Corneal implant    FAMILY HISTORY: Family History  Problem Relation Age of Onset  . Diabetes Mother     SOCIAL HISTORY: Social History   Social History  . Marital status: Single    Spouse name: N/A  . Number of children: N/A  . Years of education: N/A   Occupational History  . Not on file.   Social  History Main Topics  . Smoking status: Never Smoker  . Smokeless tobacco: Never Used  . Alcohol use No  . Drug use: No  . Sexual activity: Not on file   Other Topics Concern  . Not on file   Social History Narrative   Caffeine none.     Right handed.   [employer]- programmer   Live in home   No children.       PHYSICAL EXAM  Vitals:   05/21/16 0807  BP: (!) 137/94  Pulse: 88  Weight: 224 lb 12.8 oz (102 kg)  Height: 5\' 7"  (1.702 m)   Body mass index is 35.21 kg/m.  General: In no acute distress obese male Head: head normocephalic and atraumatic.   Neck: supple with no carotid  bruits Cardiovascular: regular rate and rhythm, no murmurs Musculoskeletal: no deformity Skin:  no rash/petichiae Vascular:  Normal pulses all extremities Mental Status: Awake and fully alert. Oriented to place and time. Recent and remote memory intact. Attention span, concentration and fund of knowledge appropriate. Mood and affect appropriate.  Cranial Nerves:  Pupils equal, briskly reactive to light. Extraocular movements full without nystagmus. Visual fields full to confrontation. Hearing intact. Facial sensation intact. Face, tongue, palate moves normally and symmetrically.  Motor: Normal bulk and tone. Normal strength in all tested extremity muscles. Sensory.: intact to touch , pinprick , position and vibratory sensation in the upper and lower extremities.  Coordination: Rapid alternating movements normal in all extremities. Finger-to-nose and heel-to-shin performed accurately bilaterally. Gait and Station: Arises from chair with slight difficulty. Stance is broad based. Gait normal, able to heel, toe and tandem walk without difficulty.  Reflexes: 2+   and symmetric.   DIAGNOSTIC DATA (LABS, IMAGING, TESTING) - I reviewed patient records, labs, notes, testing and imaging myself where available.  Lab Results  Component Value Date   WBC 10.0 05/15/2015   HGB 14.5 05/15/2015   HCT 43.0  05/15/2015   MCV 96.0 05/15/2015   PLT 253 05/15/2015      Component Value Date/Time   NA 142 05/15/2015 0727   K 3.8 05/15/2015 0727   CL 109 05/15/2015 0727   CO2 26 05/15/2015 0727   GLUCOSE 168 (H) 05/15/2015  0727   BUN 10 05/15/2015 0727   CREATININE 0.70 05/15/2015 0727   CALCIUM 8.7 (L) 05/15/2015 0727   PROT 7.8 05/13/2015 1807   ALBUMIN 4.3 05/13/2015 1807   AST 24 05/13/2015 1807   ALT 18 05/13/2015 1807   ALKPHOS 60 05/13/2015 1807   BILITOT 0.9 05/13/2015 1807   GFRNONAA >60 05/15/2015 0727   GFRAA >60 05/15/2015 0727   Lab Results  Component Value Date   CHOL 140 05/14/2015   HDL 29 (L) 05/14/2015   LDLCALC 67 05/14/2015   TRIG 219 (H) 05/14/2015   CHOLHDL 4.8 05/14/2015   Lab Results  Component Value Date   HGBA1C 7.1 (H) 05/14/2015   Lab Results  Component Value Date   VITAMINB12 407 05/14/2015    ASSESSMENT AND PLAN 50 year male with  history of progressive left hand paresthesias as well as more recently gait and balance difficulties with frequent falls due to right MCA branch infarct 04/2015 likely due to intracranial atherosclerosis History  of B12 deficiency  . Vascular risk factors of hypertension, diabetes, obesity, sleep apnea and intracranial atherosclerosis. The patient is a current patient of Dr. Pearlean Brownie who is out of the office today . This note is sent to the work in doctor.     CPAP every night Keep systolic blood pressure less than 130, today's reading 137/94, continue blood pressure medications Lipids are followed by PCP continue Zocor Carotid Doppler 02/22/16 was normal copy to patient Continue Plavix for  secondary stroke prevention will renew then obtain from PCP Hemoglobin A1c 6.5 or less, continue diabetic medications No further stroke or TIA symptoms since  05/13/15 If recurrent stroke symptoms occur, call 911 and proceed to the hospital Discharge from neurologic services at this time Nilda Riggs, [facility], APRN  [facility] [address] [place], [place] [address] [phone]

## 2016-05-21 NOTE — Patient Instructions (Addendum)
CPAP every night Keep systolic blood pressure less than 130, today's reading 137/94, continue blood pressure medications Lipids are followed by PCP continue Zocor Carotid Doppler 02/22/16 was normal copy to patient Continue Plavix for  secondary stroke prevention will renew then obtain from PCP Hemoglobin A1c 6.5 or less, continue diabetic medications No further stroke or TIA symptoms since  05/13/15 If recurrent stroke symptoms occur, call 911 and proceed to the hospital Discharge from neurologic services at this time

## 2016-06-28 NOTE — Progress Notes (Signed)
Personally have participated in and made any corrections needed to history, physical, neuro exam,assessment and plan as stated above.    Maree Ainley, MD [facility] 

## 2017-05-23 ENCOUNTER — Observation Stay
Admission: EM | Admit: 2017-05-23 | Discharge: 2017-05-25 | Disposition: A | Discharge status: Home / Self Care | Payer: Commercial Managed Care - PPO | Attending: Internal Medicine | Admitting: Internal Medicine

## 2017-05-23 ENCOUNTER — Observation Stay: Payer: Commercial Managed Care - PPO

## 2017-05-23 ENCOUNTER — Emergency Department: Payer: Commercial Managed Care - PPO

## 2017-05-23 ENCOUNTER — Other Ambulatory Visit: Payer: Self-pay

## 2017-05-23 ENCOUNTER — Encounter: Payer: Self-pay | Admitting: Emergency Medicine

## 2017-05-23 DIAGNOSIS — G8194 Hemiplegia, unspecified affecting left nondominant side: Secondary | ICD-10-CM | POA: Insufficient documentation

## 2017-05-23 DIAGNOSIS — I10 Essential (primary) hypertension: Secondary | ICD-10-CM

## 2017-05-23 DIAGNOSIS — G459 Transient cerebral ischemic attack, unspecified: Secondary | ICD-10-CM

## 2017-05-23 DIAGNOSIS — E119 Type 2 diabetes mellitus without complications: Secondary | ICD-10-CM | POA: Diagnosis not present

## 2017-05-23 DIAGNOSIS — E669 Obesity, unspecified: Secondary | ICD-10-CM | POA: Diagnosis not present

## 2017-05-23 DIAGNOSIS — G4733 Obstructive sleep apnea (adult) (pediatric): Secondary | ICD-10-CM | POA: Insufficient documentation

## 2017-05-23 DIAGNOSIS — J45909 Unspecified asthma, uncomplicated: Secondary | ICD-10-CM | POA: Diagnosis not present

## 2017-05-23 DIAGNOSIS — Z7902 Long term (current) use of antithrombotics/antiplatelets: Secondary | ICD-10-CM | POA: Insufficient documentation

## 2017-05-23 DIAGNOSIS — E785 Hyperlipidemia, unspecified: Secondary | ICD-10-CM | POA: Insufficient documentation

## 2017-05-23 DIAGNOSIS — E538 Deficiency of other specified B group vitamins: Secondary | ICD-10-CM | POA: Insufficient documentation

## 2017-05-23 DIAGNOSIS — Z9989 Dependence on other enabling machines and devices: Secondary | ICD-10-CM | POA: Insufficient documentation

## 2017-05-23 DIAGNOSIS — R29701 NIHSS score 1: Secondary | ICD-10-CM | POA: Insufficient documentation

## 2017-05-23 DIAGNOSIS — R29898 Other symptoms and signs involving the musculoskeletal system: Secondary | ICD-10-CM

## 2017-05-23 DIAGNOSIS — Z794 Long term (current) use of insulin: Secondary | ICD-10-CM | POA: Diagnosis not present

## 2017-05-23 DIAGNOSIS — I639 Cerebral infarction, unspecified: Secondary | ICD-10-CM

## 2017-05-23 DIAGNOSIS — Z23 Encounter for immunization: Secondary | ICD-10-CM | POA: Diagnosis not present

## 2017-05-23 DIAGNOSIS — I638 Other cerebral infarction: Secondary | ICD-10-CM | POA: Diagnosis not present

## 2017-05-23 DIAGNOSIS — Z79899 Other long term (current) drug therapy: Secondary | ICD-10-CM | POA: Diagnosis not present

## 2017-05-23 DIAGNOSIS — Z6836 Body mass index (BMI) 36.0-36.9, adult: Secondary | ICD-10-CM | POA: Diagnosis not present

## 2017-05-23 LAB — COMPREHENSIVE METABOLIC PANEL
ALT: 25 U/L (ref 17–63)
ANION GAP: 12 (ref 5–15)
AST: 30 U/L (ref 15–41)
Albumin: 4.3 g/dL (ref 3.5–5.0)
Alkaline Phosphatase: 56 U/L (ref 38–126)
BUN: 12 mg/dL (ref 6–20)
CHLORIDE: 101 mmol/L (ref 101–111)
CO2: 24 mmol/L (ref 22–32)
Calcium: 8.8 mg/dL — ABNORMAL LOW (ref 8.9–10.3)
Creatinine, Ser: 0.78 mg/dL (ref 0.61–1.24)
GFR calc non Af Amer: >60 mL/min (ref >60)
Glucose, Bld: 148 mg/dL — ABNORMAL HIGH (ref 65–99)
POTASSIUM: 3.4 mmol/L — AB (ref 3.5–5.1)
SODIUM: 137 mmol/L (ref 135–145)
Total Bilirubin: 1.1 mg/dL (ref 0.3–1.2)
Total Protein: 7.9 g/dL (ref 6.5–8.1)

## 2017-05-23 LAB — CBC
HCT: 42.2 % (ref 40.0–52.0)
Hemoglobin: 14.7 g/dL (ref 13.0–18.0)
MCH: 33.2 pg (ref 26.0–34.0)
MCHC: 34.8 g/dL (ref 32.0–36.0)
MCV: 95.5 fL (ref 80.0–100.0)
Platelets: 286 10*3/uL (ref 150–440)
RBC: 4.42 MIL/uL (ref 4.40–5.90)
RDW: 13.8 % (ref 11.5–14.5)
WBC: 11.7 10*3/uL — ABNORMAL HIGH (ref 3.8–10.6)

## 2017-05-23 LAB — DIFFERENTIAL
BASOS PCT: 1 %
Basophils Absolute: 0.1 10*3/uL (ref 0–0.1)
EOS PCT: 2 %
Eosinophils Absolute: 0.2 10*3/uL (ref 0–0.7)
Lymphocytes Relative: 14 %
Lymphs Abs: 1.6 10*3/uL (ref 1.0–3.6)
MONO ABS: 0.9 10*3/uL (ref 0.2–1.0)
Monocytes Relative: 8 %
NEUTROS ABS: 8.9 10*3/uL — AB (ref 1.4–6.5)
Neutrophils Relative %: 75 %

## 2017-05-23 LAB — PROTIME-INR
INR: 0.93
Prothrombin Time: 12.4 seconds (ref 11.4–15.2)

## 2017-05-23 LAB — GLUCOSE, CAPILLARY
GLUCOSE-CAPILLARY: 220 mg/dL — AB (ref 65–99)
Glucose-Capillary: 102 mg/dL — ABNORMAL HIGH (ref 65–99)
Glucose-Capillary: 228 mg/dL — ABNORMAL HIGH (ref 65–99)
Glucose-Capillary: 136 mg/dL — ABNORMAL HIGH (ref 65–99)

## 2017-05-23 LAB — TSH: TSH: 1.475 u[IU]/mL (ref 0.350–4.500)

## 2017-05-23 LAB — APTT: aPTT: 26 seconds (ref 24–36)

## 2017-05-23 LAB — LIPID PANEL
Cholesterol: 152 mg/dL (ref 0–200)
HDL: 33 mg/dL — AB (ref >40)
LDL Cholesterol: 64 mg/dL (ref 0–99)
Total CHOL/HDL Ratio: 4.6 RATIO
Triglycerides: 273 mg/dL — ABNORMAL HIGH (ref <150)
VLDL: 55 mg/dL — ABNORMAL HIGH (ref 0–40)

## 2017-05-23 LAB — HEMOGLOBIN A1C
Hgb A1c MFr Bld: 6.8 % — ABNORMAL HIGH (ref 4.8–5.6)
Mean Plasma Glucose: 148.46 mg/dL

## 2017-05-23 LAB — VITAMIN B12: Vitamin B-12: 1471 pg/mL — ABNORMAL HIGH (ref 180–914)

## 2017-05-23 LAB — TROPONIN I: Troponin I: <0.03 ng/mL (ref <0.03)

## 2017-05-23 MED ORDER — INSULIN GLARGINE 100 UNIT/ML ~~LOC~~ SOLN
24.0000 [IU] | Freq: Every day | SUBCUTANEOUS | Status: DC
  Administered 2017-05-24 – 2017-05-25 (×2): 24 [IU] via SUBCUTANEOUS
  Filled 2017-05-23 (×2): qty 0.24

## 2017-05-23 MED ORDER — POTASSIUM CHLORIDE CRYS ER 20 MEQ PO TBCR
40.0000 meq | EXTENDED_RELEASE_TABLET | Freq: Once | ORAL | Status: AC
Start: 2017-05-23 — End: 2017-05-23
  Administered 2017-05-23: 40 meq via ORAL
  Filled 2017-05-23: qty 2

## 2017-05-23 MED ORDER — ACETAMINOPHEN 325 MG PO TABS: 650.0000 mg | ORAL_TABLET | Freq: Four times a day (QID) | ORAL | Status: DC | PRN

## 2017-05-23 MED ORDER — INSULIN ASPART 100 UNIT/ML ~~LOC~~ SOLN
0.0000 [IU] | Freq: Three times a day (TID) | SUBCUTANEOUS | Status: DC
  Administered 2017-05-24: 08:00:00 2 [IU] via SUBCUTANEOUS
  Administered 2017-05-24: 12:00:00 3 [IU] via SUBCUTANEOUS
  Administered 2017-05-24: 1 [IU] via SUBCUTANEOUS
  Administered 2017-05-25: 2 [IU] via SUBCUTANEOUS
  Filled 2017-05-23 (×4): qty 1

## 2017-05-23 MED ORDER — CLOPIDOGREL BISULFATE 75 MG PO TABS
75.0000 mg | ORAL_TABLET | Freq: Every day | ORAL | Status: DC
  Administered 2017-05-24 – 2017-05-25 (×2): 75 mg via ORAL
  Filled 2017-05-23 (×2): qty 1

## 2017-05-23 MED ORDER — NIACIN ER (ANTIHYPERLIPIDEMIC) 500 MG PO TBCR
1500.0000 mg | EXTENDED_RELEASE_TABLET | Freq: Every day | ORAL | Status: DC
  Administered 2017-05-23 – 2017-05-24 (×2): 1500 mg via ORAL
  Filled 2017-05-23 (×2): qty 3

## 2017-05-23 MED ORDER — INSULIN ASPART 100 UNIT/ML ~~LOC~~ SOLN
0.0000 [IU] | Freq: Every day | SUBCUTANEOUS | Status: DC
Start: 2017-05-23 — End: 2017-05-25
  Administered 2017-05-24: 2 [IU] via SUBCUTANEOUS
  Filled 2017-05-23: qty 1

## 2017-05-23 MED ORDER — PNEUMOCOCCAL VAC POLYVALENT 25 MCG/0.5ML IJ INJ
0.5000 mL | INJECTION | INTRAMUSCULAR | Status: AC
  Administered 2017-05-24: 08:00:00 0.5 mL via INTRAMUSCULAR
  Filled 2017-05-23: qty 0.5

## 2017-05-23 MED ORDER — ASPIRIN 81 MG PO CHEW
324.0000 mg | CHEWABLE_TABLET | Freq: Once | ORAL | Status: AC
Start: 2017-05-23 — End: 2017-05-23
  Administered 2017-05-23: 324 mg via ORAL
  Filled 2017-05-23: qty 4

## 2017-05-23 MED ORDER — AMLODIPINE BESYLATE 5 MG PO TABS
10.0000 mg | ORAL_TABLET | Freq: Every day | ORAL | Status: DC
  Administered 2017-05-24 – 2017-05-25 (×2): 10 mg via ORAL
  Filled 2017-05-23 (×2): qty 2

## 2017-05-23 MED ORDER — ENOXAPARIN SODIUM 40 MG/0.4ML ~~LOC~~ SOLN
40.0000 mg | SUBCUTANEOUS | Status: DC
  Administered 2017-05-23 – 2017-05-24 (×2): 40 mg via SUBCUTANEOUS
  Filled 2017-05-23 (×2): qty 0.4

## 2017-05-23 MED ORDER — ONDANSETRON HCL 4 MG/2ML IJ SOLN
4.0000 mg | Freq: Four times a day (QID) | INTRAMUSCULAR | Status: DC | PRN
  Administered 2017-05-24 (×2): 4 mg via INTRAVENOUS
  Filled 2017-05-23 (×2): qty 2

## 2017-05-23 MED ORDER — ONDANSETRON HCL 4 MG PO TABS: 4.0000 mg | ORAL_TABLET | Freq: Four times a day (QID) | ORAL | Status: DC | PRN

## 2017-05-23 MED ORDER — SIMVASTATIN 20 MG PO TABS
20.0000 mg | ORAL_TABLET | Freq: Every day | ORAL | Status: DC
  Administered 2017-05-23: 22:00:00 20 mg via ORAL
  Filled 2017-05-23: qty 1

## 2017-05-23 MED ORDER — FUROSEMIDE 40 MG PO TABS
40.0000 mg | ORAL_TABLET | Freq: Every day | ORAL | Status: DC
  Administered 2017-05-24 – 2017-05-25 (×2): 40 mg via ORAL
  Filled 2017-05-23 (×2): qty 1

## 2017-05-23 MED ORDER — LOSARTAN POTASSIUM 50 MG PO TABS
100.0000 mg | ORAL_TABLET | Freq: Every day | ORAL | Status: DC
  Administered 2017-05-24 – 2017-05-25 (×2): 100 mg via ORAL
  Filled 2017-05-23 (×2): qty 2

## 2017-05-23 MED ORDER — METOPROLOL SUCCINATE ER 50 MG PO TB24
50.0000 mg | ORAL_TABLET | Freq: Every day | ORAL | Status: DC
  Administered 2017-05-24 – 2017-05-25 (×2): 50 mg via ORAL
  Filled 2017-05-23 (×3): qty 1

## 2017-05-23 MED ORDER — EXENATIDE ER 2 MG ~~LOC~~ SRER: 2.0000 mg | SUBCUTANEOUS | Status: DC

## 2017-05-23 MED ORDER — ACETAMINOPHEN 650 MG RE SUPP: 650.0000 mg | Freq: Four times a day (QID) | RECTAL | Status: DC | PRN

## 2017-05-23 MED ORDER — METFORMIN HCL 500 MG PO TABS
1000.0000 mg | ORAL_TABLET | Freq: Two times a day (BID) | ORAL | Status: DC
  Administered 2017-05-24 – 2017-05-25 (×3): 1000 mg via ORAL
  Filled 2017-05-23 (×4): qty 2

## 2017-05-23 NOTE — Progress Notes (Signed)
Pt placed on [facility] C-1 CPAP. CPAP plugged into red outlet. Pt tolerating well at this time

## 2017-05-23 NOTE — H&P (Signed)
[facility] [place] [facility]   PATIENT NAME: Roger [name]    MR#:  [id number]  DATE OF BIRTH:  Dec 10, 1965  DATE OF ADMISSION:  05/23/2017  PRIMARY CARE PHYSICIAN: Dan Maker, MD   REQUESTING/REFERRING PHYSICIAN: Dr. Sharman Cheek  CHIEF COMPLAINT:   Chief Complaint  Patient presents with  . Code Stroke    HISTORY OF PRESENT ILLNESS:  Roger [name]  is a 51 y.o. male with a known history of hypertension, prior history of stroke with no residual neurological deficits, sleep apnea on CPAP, diabetes mellitus, B12 deficiency presents to hospital secondary to gait changes and also left perioral tingling that started this morning. His prior stroke was about 2 years ago and involved  speech changes which have resolved. He was on aspirin and Plavix for a year and then aspirin was discontinued by an outpatient neurologist. Patient was doing fine up until this morning. He went to work and noticed that his gait was staggering and he was leaning more towards his left side. He denies any changes in his speech or strength. Also felt left sided peri oral tingling too today. CT of the head here is negative. Being admitted for possible TIA workup. Denies any fevers or chills. No recent travel. No other complaints  PAST MEDICAL HISTORY:   Past Medical History:  Diagnosis Date  . B12 deficiency   . Diabetes mellitus without complication (HCC)    insulin-dependent  . Hyperlipidemia   . Hypertension   . Obesity   . OSA on CPAP   . RAD (reactive airway disease)   . Stroke [facility])     PAST SURGICAL HISTORY:   Past Surgical History:  Procedure Laterality Date  . EYE SURGERY Right    Corneal implant    SOCIAL HISTORY:   Social History  Substance Use Topics  . Smoking status: Never Smoker  . Smokeless tobacco: Never Used  . Alcohol use No    FAMILY HISTORY:   Family History  Problem Relation Age of Onset  . Diabetes Mother     DRUG ALLERGIES:    No Known Allergies  REVIEW OF SYSTEMS:   Review of Systems  Constitutional: Negative for chills, fever, malaise/fatigue and weight loss.  HENT: Negative for ear discharge, ear pain, hearing loss, nosebleeds and tinnitus.   Eyes: Negative for blurred vision, double vision and photophobia.  Respiratory: Negative for cough, hemoptysis, shortness of breath and wheezing.   Cardiovascular: Negative for chest pain, palpitations, orthopnea and leg swelling.  Gastrointestinal: Negative for abdominal pain, constipation, diarrhea, heartburn, melena, nausea and vomiting.  Genitourinary: Negative for dysuria, frequency, hematuria and urgency.  Musculoskeletal: Negative for back pain, myalgias and neck pain.  Skin: Negative for rash.  Neurological: Positive for sensory change and focal weakness. Negative for dizziness, tingling, tremors, speech change and headaches.       Ataxia  Endo/Heme/Allergies: Does not bruise/bleed easily.  Psychiatric/Behavioral: Negative for depression.    MEDICATIONS AT HOME:   Prior to Admission medications   Medication Sig Start Date End Date Taking? Authorizing Provider  amLODipine (NORVASC) 10 MG tablet Take 10 mg by mouth. 05/19/15  Yes [provider]  clopidogrel (PLAVIX) 75 MG tablet Take 1 tablet (75 mg total) by mouth daily. 05/21/16  Yes Nilda Riggs, NP  Exenatide ER (BYDUREON) 2 MG SRER Inject 2 mg into the skin once a week. Pt uses on Sunday.   Yes [provider]  furosemide (LASIX) 20 MG tablet Take  40 mg by mouth daily.    Yes [provider]  insulin glargine (LANTUS) 100 UNIT/ML injection Inject 20-25 Units into the skin 2 (two) times daily. Pt uses 25 units in the morning and 20 units at bedtime.   Yes [provider]  losartan (COZAAR) 100 MG tablet Take 100 mg by mouth daily.   Yes [provider]  metFORMIN (GLUCOPHAGE) 1000 MG tablet Take 1 tablet (1,000 mg total) by mouth 2 (two) times daily with  a meal. 05/14/15  Yes Gale Journey, MD  metoprolol succinate (TOPROL-XL) 50 MG 24 hr tablet Take 50 mg by mouth daily.    Yes [provider]  niacin (NIASPAN) 500 MG CR tablet Take 1,500 mg by mouth at bedtime.   Yes [provider]  simvastatin (ZOCOR) 20 MG tablet Take 20 mg by mouth at bedtime.   Yes [provider]  albuterol (PROVENTIL HFA;VENTOLIN HFA) 108 (90 BASE) MCG/ACT inhaler Inhale 2 puffs into the lungs every 6 (six) hours as needed for wheezing or shortness of breath.    [provider]  glucose blood (BAYER CONTOUR NEXT TEST) test strip use three times a day 09/03/14   [provider]  NEEDLE, DISP, 27 G 27G X 1/2" MISC Use with B12 injection 04/28/15   [provider]  [facility] DELICA LANCETS FINE MISC Inject into the skin. 04/22/15   [provider]  SYRINGE-NEEDLE, DISP, 3 ML (BD ECLIPSE SYRINGE) 25G X 1" 3 ML MISC Use with B12 injection 04/28/15   [provider]      VITAL SIGNS:  Blood pressure (!) 160/102, pulse 84, temperature 98.3 F (36.8 C), resp. rate 15, height  (1.702 m), weight 104.3 kg (230 lb), SpO2 96 %.  PHYSICAL EXAMINATION:   Physical Exam  GENERAL:  51 y.o.-year-old obese patient lying in the bed with no acute distress.  EYES: Pupils equal, round, reactive to light and accommodation. No scleral icterus. Extraocular muscles intact.  HEENT: Head atraumatic, normocephalic. Oropharynx and nasopharynx clear.  NECK:  Supple, no jugular venous distention. No thyroid enlargement, no tenderness.  LUNGS: Normal breath sounds bilaterally, no wheezing, rales,rhonchi or crepitation. No use of accessory muscles of respiration.  CARDIOVASCULAR: S1, S2 normal. No murmurs, rubs, or gallops.  ABDOMEN: Soft, nontender, nondistended. Bowel sounds present. No organomegaly or mass.  EXTREMITIES: No pedal edema, cyanosis, or clubbing.  NEUROLOGIC: Cranial nerves II through XII are intact. Muscle  strength 5/5 in all extremities. Sensation intact. Gait not checked.  PSYCHIATRIC: The patient is alert and oriented x 3.  SKIN: No obvious rash, lesion, or ulcer.   LABORATORY PANEL:   CBC  Recent Labs Lab 05/23/17 1225  WBC 11.7*  HGB 14.7  HCT 42.2  PLT 286   ------------------------------------------------------------------------------------------------------------------  Chemistries   Recent Labs Lab 05/23/17 1225  NA 137  K 3.4*  CL 101  CO2 24  GLUCOSE 148*  BUN 12  CREATININE 0.78  CALCIUM 8.8*  AST 30  ALT 25  ALKPHOS 56  BILITOT 1.1   ------------------------------------------------------------------------------------------------------------------  Cardiac Enzymes  Recent Labs Lab 05/23/17 1225  TROPONINI <0.03   ------------------------------------------------------------------------------------------------------------------  RADIOLOGY:  Ct Head Code Stroke Wo Contrast  Result Date: 05/23/2017 CLINICAL DATA:  Code stroke.  Subacute neuro deficits. EXAM: CT HEAD WITHOUT CONTRAST TECHNIQUE: Contiguous axial images were obtained from the base of the skull through the vertex without intravenous contrast. COMPARISON:  CT head 05/13/2015, MRI 05/14/2015, CTA 05/14/2015 FINDINGS: Brain: Chronic deep white  matter infarct on the right. No acute infarct. Negative for hemorrhage or mass. Negative for hydrocephalus. Mild asymmetric enlargement right lateral ventricle due to chronic infarct in volume loss. Vascular: Negative for hyperdense vessel Skull: Negative Sinuses/Orbits: Negative Other: None ASPECTS (Alberta Stroke Program Early CT Score) - Ganglionic level infarction (caudate, lentiform nuclei, internal capsule, insula, M1-M3 cortex): 7 - Supraganglionic infarction (M4-M6 cortex): 3 Total score (0-10 with 10 being normal): 10 IMPRESSION: 1. 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 No acute intracranial abnormality 2. ASPECTS is 10 These results were called  by telephone at the time of interpretation on 05/23/2017 at 12:38 pm to Dr. Sharman Cheek , who verbally acknowledged these results. Electronically Signed   By: Marlan Palau M.D.   On: 05/23/2017 12:39    EKG:   Orders placed or performed during the hospital encounter of 05/23/17  . ED EKG  . ED EKG    IMPRESSION AND PLAN:   Kshawn Canal  is a 51 y.o. male with a known history of hypertension, prior history of stroke with no residual neurological deficits, sleep apnea on CPAP, diabetes mellitus, B12 deficiency presents to hospital secondary to gait changes and also left perioral tingling that started this morning.  #1 TIA-admit under observation, MRI of the brain, carotid Dopplers and echocardiogram ordered. -Continue Plavix and statin for now. -Neuro checks. -Check B12 to rule out B12 deficiency related neurological symptoms -PT consult  #2 diabetes mellitus-on Lantus, metformin. Hold exenatide. -On sliding scale insulin  #3 hypertension-on Toprol, losartan and Norvasc  #4 hyperlipidemia-on statin. Follow-up LDL  #5 DVT prophylaxis-Lovenox    All the records are reviewed and case discussed with ED provider. Management plans discussed with the patient, family and they are in agreement.  CODE STATUS: Full Code  TOTAL TIME TAKING CARE OF THIS PATIENT: 50 minutes.    Enid Baas M.D on 05/23/2017 at 3:19 PM  Between 7am to 6pm - Pager - [phone]  After 6pm go to [email or web address] - password [email or web address]  [facility]  Office  [phone]  CC: Primary care physician; Dan Maker, MD

## 2017-05-23 NOTE — ED Notes (Signed)
BG 220

## 2017-05-23 NOTE — ED Provider Notes (Signed)
[facility] Emergency Department Provider Note  ____________________________________________  Time seen: Approximately 1:55 PM  I have reviewed the triage vital signs and the nursing notes.   HISTORY  Chief Complaint Code Stroke    HPI Roger [name] is a 51 y.o. male who reports weakness in the left leg for the past 2 days, worse since 10 AM this morning. He is also felt off balance and falling to the left for the past 2 days. This morning when he started up his pants on he fell against the wall because he couldn't hold himself up. He also has some tingling at the left side of his mouth but denies any facial weakness or vision changes. Denies headache. Does have a history of stroke hypertension and hyperlipidemia and diabetes. Reports compliance with his medications.  no aggravating or alleviating factors. Symptoms have been constant since onset.   Past Medical History:  Diagnosis Date  . B12 deficiency   . Diabetes mellitus without complication (HCC)    insulin-dependent  . Hyperlipidemia   . Hypertension   . Obesity   . OSA on CPAP   . RAD (reactive airway disease)   . Stroke [facility])      Patient Active Problem List   Diagnosis Date Noted  . Embolic stroke (HCC) 05/23/2015  . Intracranial atherosclerosis 05/23/2015  . CVA (cerebral infarction) 05/16/2015  . TIA (transient ischemic attack) 05/13/2015  . HTN (hypertension) 05/13/2015  . HLD (hyperlipidemia) 05/13/2015  . OSA on CPAP 05/13/2015  . Type 2 diabetes mellitus (HCC) 05/13/2015  . Tingling in extremities 04/25/2015  . Carpal tunnel syndrome 04/25/2015  . 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 B12 deficiency 04/25/2015     Past Surgical History:  Procedure Laterality Date  . EYE SURGERY Right    Corneal implant     Prior to Admission medications   Medication Sig Start Date End Date Taking? Authorizing Provider  amLODipine (NORVASC) 10 MG tablet Take 10 mg by mouth. 05/19/15  Yes  [provider]  clopidogrel (PLAVIX) 75 MG tablet Take 1 tablet (75 mg total) by mouth daily. 05/21/16  Yes Nilda Riggs, NP  Exenatide ER (BYDUREON) 2 MG SRER Inject 2 mg into the skin once a week. Pt uses on Sunday.   Yes [provider]  furosemide (LASIX) 20 MG tablet Take 40 mg by mouth daily.    Yes [provider]  insulin glargine (LANTUS) 100 UNIT/ML injection Inject 20-25 Units into the skin 2 (two) times daily. Pt uses 25 units in the morning and 20 units at bedtime.   Yes [provider]  losartan (COZAAR) 100 MG tablet Take 100 mg by mouth daily.   Yes [provider]  metFORMIN (GLUCOPHAGE) 1000 MG tablet Take 1 tablet (1,000 mg total) by mouth 2 (two) times daily with a meal. 05/14/15  Yes Gale Journey, MD  metoprolol succinate (TOPROL-XL) 50 MG 24 hr tablet Take 50 mg by mouth daily.    Yes [provider]  niacin (NIASPAN) 500 MG CR tablet Take 1,500 mg by mouth at bedtime.   Yes [provider]  simvastatin (ZOCOR) 20 MG tablet Take 20 mg by mouth at bedtime.   Yes [provider]  albuterol (PROVENTIL HFA;VENTOLIN HFA) 108 (90 BASE) MCG/ACT inhaler Inhale 2 puffs into the lungs every 6 (six) hours as needed for wheezing or shortness of breath.    [provider]  glucose blood (BAYER CONTOUR NEXT TEST) test strip use  three times a day 09/03/14   [provider]  NEEDLE, DISP, 27 G 27G X 1/2" MISC Use with B12 injection 04/28/15   [provider]  [facility] DELICA LANCETS FINE MISC Inject into the skin. 04/22/15   [provider]  SYRINGE-NEEDLE, DISP, 3 ML (BD ECLIPSE SYRINGE) 25G X 1" 3 ML MISC Use with B12 injection 04/28/15   [provider]     Allergies Patient has no known allergies.   Family History  Problem Relation Age of Onset  . Diabetes Mother     Social History Social History  Substance Use Topics  . Smoking status: Never Smoker  .  Smokeless tobacco: Never Used  . Alcohol use No    Review of Systems  Constitutional:   No fever or chills.  ENT:   No sore throat. No rhinorrhea. Cardiovascular:   No chest pain or syncope. Respiratory:   No dyspnea or cough. Gastrointestinal:   Negative for abdominal pain, vomiting and diarrhea.  Musculoskeletal:   Negative for focal pain or swelling All other systems reviewed and are negative except as documented above in ROS and HPI.  ____________________________________________   PHYSICAL EXAM:  VITAL SIGNS: ED Triage Vitals  Enc Vitals Group     BP 05/23/17 1205 (!) 163/89     Pulse Rate 05/23/17 1205 86     Resp 05/23/17 1205 18     Temp 05/23/17 1205 98.3 F (36.8 C)     Temp Source 05/23/17 1205 Oral     SpO2 05/23/17 1205 99 %     Weight 05/23/17 1206 230 lb (104.3 kg)     Height 05/23/17 1206  (1.702 m)     Head Circumference --      Peak Flow --      Pain Score --      Pain Loc --      Pain Edu? --      Excl. in GC? --     Vital signs reviewed, nursing assessments reviewed.   Constitutional:   Alert and oriented. Well appearing and in no distress. Eyes:   No scleral icterus.  EOMI. No nystagmus. No conjunctival pallor. PERRL. ENT   Head:   Normocephalic and atraumatic.   Nose:   No congestion/rhinnorhea.    Mouth/Throat:   MMM, no pharyngeal erythema. No peritonsillar mass.    Neck:   No meningismus. Full ROM Hematological/Lymphatic/Immunilogical:   No cervical lymphadenopathy. Cardiovascular:   RRR. Symmetric bilateral radial and DP pulses.  No murmurs.  Respiratory:   Normal respiratory effort without tachypnea/retractions. Breath sounds are clear and equal bilaterally. No wheezes/rales/rhonchi. Gastrointestinal:   Soft and nontender. Non distended. There is no CVA tenderness.  No rebound, rigidity, or guarding. Genitourinary:   deferred Musculoskeletal:   Normal range of motion in all extremities. No joint effusions.  No lower  extremity tenderness.  No edema. Neurologic:   Normal speech and language.  cranial nerves II through XII intact No pronator drift Normal finger to nose 4-5 strength in the left leg NIH stroke scale 1 .  Skin:    Skin is warm, dry and intact. No rash noted.  No petechiae, purpura, or bullae.  ____________________________________________    LABS (pertinent positives/negatives) (all labs ordered are listed, but only abnormal results are displayed) Labs Reviewed  CBC - Abnormal; Notable for the following:       Result Value   WBC 11.7 (*)    All other components within normal limits  DIFFERENTIAL - Abnormal; Notable for the following:    Neutro Abs 8.9 (*)    All other components within normal limits  COMPREHENSIVE METABOLIC PANEL - Abnormal; Notable for the following:    Potassium 3.4 (*)    Glucose, Bld 148 (*)    Calcium 8.8 (*)    All other components within normal limits  GLUCOSE, CAPILLARY - Abnormal; Notable for the following:    Glucose-Capillary 220 (*)    All other components within normal limits  PROTIME-INR  APTT  TROPONIN I  VITAMIN B12  CBG MONITORING, ED   ____________________________________________   EKG  interpreted by me  Date: 05/23/2017  Rate: 89  Rhythm: normal sinus rhythm  QRS Axis: normal  Intervals: normal  ST/T Wave abnormalities: normal  Conduction Disutrbances: none  Narrative Interpretation: unremarkable      ____________________________________________    RADIOLOGY  Ct Head Code Stroke Wo Contrast  Result Date: 05/23/2017 CLINICAL DATA:  Code stroke.  Subacute neuro deficits. EXAM: CT HEAD WITHOUT CONTRAST TECHNIQUE: Contiguous axial images were obtained from the base of the skull through the vertex without intravenous contrast. COMPARISON:  CT head 05/13/2015, MRI 05/14/2015, CTA 05/14/2015 FINDINGS: Brain: Chronic deep white matter infarct on the right. No acute infarct. Negative for hemorrhage or mass. Negative for  hydrocephalus. Mild asymmetric enlargement right lateral ventricle due to chronic infarct in volume loss. Vascular: Negative for hyperdense vessel Skull: Negative Sinuses/Orbits: Negative Other: None ASPECTS (Alberta Stroke Program Early CT Score) - Ganglionic level infarction (caudate, lentiform nuclei, internal capsule, insula, M1-M3 cortex): 7 - Supraganglionic infarction (M4-M6 cortex): 3 Total score (0-10 with 10 being normal): 10 IMPRESSION: 1. 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 No acute intracranial abnormality 2. ASPECTS is 10 These results were called by telephone at the time of interpretation on 05/23/2017 at 12:38 pm to Dr. Sharman Cheek , who verbally acknowledged these results. Electronically Signed   By: Marlan Palau M.D.   On: 05/23/2017 12:39    ____________________________________________   PROCEDURES Procedures  ____________________________________________   INITIAL IMPRESSION / ASSESSMENT AND PLAN / ED COURSE  Pertinent labs & imaging results that were available during my care of the patient were reviewed by me and considered in my medical decision making (see chart for details).    Clinical Course as of May 24 1419  Thu May 23, 2017  1238 D/w radiology, has old white matter infarct from a few years ago. Has intracranial atherosclerosis, but no acute findings.   [PS]    Clinical Course User Index [PS] Sharman Cheek, MD    ----------------------------------------- 2:20 PM on 05/23/2017 -----------------------------------------  Discussed with neurologist who feels that this is an ischemic stroke although minor, the patient has extensive risk factors, and warrants further in-hospital workup for risk stratification and modification. Patient given a full dose aspirin, case discussed with the hospitalist.   ____________________________________________   FINAL CLINICAL IMPRESSION(S) / ED DIAGNOSES  Final diagnoses:  Acute ischemic stroke (HCC)   Left leg weakness      New Prescriptions   No medications on file     Portions of this note were generated with dragon dictation software. Dictation errors may occur despite best attempts at proofreading.    Sharman Cheek, MD 05/23/17 1421

## 2017-05-23 NOTE — Progress Notes (Signed)
CH responded to a PG for a Code Stroke in ED-05. Pt was being assessed upon my arrival. Pt does not have family in the waiting area and did not wish for me to contact anyone. CH prayed silently at the door and is available for follow up as needed.    05/23/17 1300  Clinical Encounter Type  Visited With Patient;Health care provider  Visit Type Initial;Spiritual support;Code;ED (Code Stroke)  Referral From Nurse  Consult/Referral To Chaplain  Spiritual Encounters  Spiritual Needs Emotional

## 2017-05-23 NOTE — ED Triage Notes (Signed)
Pt states around 10am he felt himself leaning to the left when he walked. Has had 2 previous strokes with no symptoms. Pt states he has felt this way before when his B12 was low. Speech clear, grips strong bilaterally. States he is just noticing tingling at the left side of his mouth. Appears in no distress however, behavior is "odd." Answering questions appropriately.

## 2017-05-23 NOTE — ED Notes (Signed)
SOC    CALLED 

## 2017-05-23 NOTE — ED Notes (Signed)
SOC  DONE  REPORT  GIVEN  TO MD 

## 2017-05-24 ENCOUNTER — Observation Stay: Payer: Commercial Managed Care - PPO

## 2017-05-24 ENCOUNTER — Observation Stay (HOSPITAL_BASED_OUTPATIENT_CLINIC_OR_DEPARTMENT_OTHER)
Admit: 2017-05-24 | Discharge: 2017-05-24 | Disposition: A | Discharge status: Home / Self Care | Payer: Commercial Managed Care - PPO | Attending: Internal Medicine | Admitting: Internal Medicine

## 2017-05-24 DIAGNOSIS — I639 Cerebral infarction, unspecified: Secondary | ICD-10-CM | POA: Diagnosis not present

## 2017-05-24 DIAGNOSIS — G459 Transient cerebral ischemic attack, unspecified: Secondary | ICD-10-CM

## 2017-05-24 LAB — BASIC METABOLIC PANEL
Anion gap: 7 (ref 5–15)
BUN: 14 mg/dL (ref 6–20)
CHLORIDE: 105 mmol/L (ref 101–111)
CO2: 28 mmol/L (ref 22–32)
Calcium: 8.8 mg/dL — ABNORMAL LOW (ref 8.9–10.3)
Creatinine, Ser: 0.66 mg/dL (ref 0.61–1.24)
GFR calc Af Amer: >60 mL/min (ref >60)
GLUCOSE: 162 mg/dL — AB (ref 65–99)
POTASSIUM: 3.9 mmol/L (ref 3.5–5.1)
Sodium: 140 mmol/L (ref 135–145)

## 2017-05-24 LAB — CBC
HEMATOCRIT: 40.5 % (ref 40.0–52.0)
Hemoglobin: 14 g/dL (ref 13.0–18.0)
MCH: 32.8 pg (ref 26.0–34.0)
MCHC: 34.5 g/dL (ref 32.0–36.0)
MCV: 95 fL (ref 80.0–100.0)
Platelets: 278 10*3/uL (ref 150–440)
RBC: 4.26 MIL/uL — ABNORMAL LOW (ref 4.40–5.90)
RDW: 13.8 % (ref 11.5–14.5)
WBC: 10.2 10*3/uL (ref 3.8–10.6)

## 2017-05-24 LAB — GLUCOSE, CAPILLARY
Glucose-Capillary: 170 mg/dL — ABNORMAL HIGH (ref 65–99)
Glucose-Capillary: 205 mg/dL — ABNORMAL HIGH (ref 65–99)
Glucose-Capillary: 135 mg/dL — ABNORMAL HIGH (ref 65–99)
Glucose-Capillary: 252 mg/dL — ABNORMAL HIGH (ref 65–99)

## 2017-05-24 MED ORDER — ATORVASTATIN CALCIUM 20 MG PO TABS
20.0000 mg | ORAL_TABLET | Freq: Every day | ORAL | Status: DC
  Administered 2017-05-24: 18:00:00 20 mg via ORAL
  Filled 2017-05-24: qty 1

## 2017-05-24 MED ORDER — MECLIZINE HCL 12.5 MG PO TABS
12.5000 mg | ORAL_TABLET | Freq: Three times a day (TID) | ORAL | Status: DC | PRN
  Administered 2017-05-24: 12.5 mg via ORAL
  Filled 2017-05-24 (×2): qty 1

## 2017-05-24 MED ORDER — ASPIRIN EC 81 MG PO TBEC
81.0000 mg | DELAYED_RELEASE_TABLET | Freq: Every day | ORAL | Status: DC
  Administered 2017-05-24 – 2017-05-25 (×2): 81 mg via ORAL
  Filled 2017-05-24: qty 1

## 2017-05-24 MED ORDER — DIAZEPAM 2 MG PO TABS
2.0000 mg | ORAL_TABLET | Freq: Four times a day (QID) | ORAL | Status: DC | PRN
  Administered 2017-05-24: 20:00:00 2 mg via ORAL
  Filled 2017-05-24: qty 1

## 2017-05-24 NOTE — Progress Notes (Signed)
Physical Therapy Evaluation Patient Details Name: Roger [name] MRN: [id number] DOB: 04-17-1966 Today's Date: 05/24/2017   History of Present Illness  Pt was admitted for CVA on 9/27, however results from brain MR demonstrates acute punctate infarcts of L medulla. PMH of 2 previous CVAs, HTN, hyperlipidemia, diabetes, B12 deficiency.   Clinical Impression  Pt is a pleasant 51 year old male who was admitted for CVA. Pt performs standing balance exercises, performs bed mobility with I., transfers with RW with supervision, and amb with RW with CGA.  Pt able to amb a total of 40 ft with RW, able to amb safely with use of RW to help maintain balance, noted minimal L lean during amb. Tested sensation, coordination, RAMPs, visual tracking, WNL bilaterally. Pt demonstrates deficits with static and dynamic balance, transfers and amb.  Pt appeared nauseas throughout session, however motivated to participate in all PT activities. Recommend use of RW to safely perform transfers and amb as pt leans to L during dynamic balance exercises. Would benefit from skilled PT to address above deficits and promote optimal return to home. Recommend transition to outpatient PT upon DC from acute hospitalization. This entire session was guided, instructed, and directly supervised by Elizabeth Palau, DPT.    Follow Up Recommendations Outpatient PT    Equipment Recommendations  Rolling walker with 5" wheels    Recommendations for Other Services       Precautions / Restrictions Precautions Precautions: Fall Precaution Comments: Pt demonstrates balance deficits Restrictions Weight Bearing Restrictions: No      Mobility  Bed Mobility Overal bed mobility: Needs Assistance Bed Mobility: Supine to Sit     Supine to sit: Independent     General bed mobility comments: Pt able to rise from supine to seated EOB with proper technique, no assist or cues needed. Pt stated he was a little nauseas, no dizziness.    Transfers Overall transfer level: Needs assistance Equipment used: Rolling walker (2 wheeled) Transfers: Sit to/from Stand Sit to Stand: Supervision         General transfer comment: Pt able to transfer from seated EOB to standing with RW with proper technique, no cues needed. Pt reported feeling a little nauseas. No dizziness or LOB.   Ambulation/Gait Ambulation/Gait assistance: Min guard Ambulation Distance (Feet): 40 Feet Assistive device: Rolling walker (2 wheeled) Gait Pattern/deviations: Step-through pattern     General Gait Details: Pt amb with alternating gait pattern, noted slightly increased lean to the L during amb when weight-bearing through LLE. Pt appeared to rely heavily on RW to maintain balance.   Stairs            Wheelchair Mobility    Modified Rankin (Stroke Patients Only)       Balance Overall balance assessment: Needs assistance Sitting-balance support: Feet supported Sitting balance-Leahy Scale: Good Sitting balance - Comments: Pt able to sit at EOB with no assist, no cues. Pt appeared to sit upright, did not appear to lean to any one side. Pt reported feeling nauseas. No dizziness or LOB.    Standing balance support: Bilateral upper extremity supported Standing balance-Leahy Scale: Poor Standing balance comment: Pt able to stand at EOB with RW with supervision. Pt was able to initially keep upright/centered posture, however gradually started to lean to the L.                              Pertinent Vitals/Pain Pain Assessment: No/denies pain  Home Living Family/patient expects to be discharged to:: Private residence Living Arrangements: Spouse/significant other;Other relatives Available Help at Discharge: Family Type of Home: House Home Access: Stairs to enter Entrance Stairs-Rails: Right Entrance Stairs-Number of Steps: 1 Home Layout: Two level Home Equipment: None      Prior Function Level of Independence:  Independent         Comments: Pt able to perform all ADLs independently with no assistive device.      Hand Dominance        Extremity/Trunk Assessment   Upper Extremity Assessment Upper Extremity Assessment: Generalized weakness (UE MMT grossly 4/5, equal B, sensation/coordination intact)    Lower Extremity Assessment Lower Extremity Assessment: Generalized weakness (LE MMT grossly 4/5, equal B, sensation/coordination intact)    Cervical / Trunk Assessment Cervical / Trunk Assessment: Normal  Communication   Communication: No difficulties  Cognition Arousal/Alertness: Lethargic Behavior During Therapy: WFL for tasks assessed/performed Overall Cognitive Status: Within Functional Limits for tasks assessed                                 General Comments: Pt reported feeling nauseas this morning      General Comments      Exercises Other Exercises Other Exercises: Supine ther-ex 10x bilateral, ankle pumps, SLRs, hip abd/add. Pt able to perform with proper technique, min cues. RLE appeared stronger than LLE.  Other Exercises: Pt performed standing balance exercises: trunk perturbations, alternating side taps with foot, alternating single side steps and front steps. Pt appeared to easily lose balance when performing exercises while standing on LLE. Pt appears to have difficulty with dynamic balance tasks and would lean into me to regain balance. Pt required cues to take small steps and try to keep upright posture   Assessment/Plan    PT Assessment Patient needs continued PT services  PT Problem List Decreased strength;Decreased range of motion;Decreased activity tolerance;Decreased balance;Decreased mobility;Decreased coordination;Decreased knowledge of use of DME       PT Treatment Interventions DME instruction;Gait training;Stair training;Functional mobility training;Therapeutic activities;Therapeutic exercise;Balance training;Patient/family education     PT Goals (Current goals can be found in the Care Plan section)  Acute Rehab PT Goals Patient Stated Goal: to return home PT Goal Formulation: With patient Time For Goal Achievement: 06/07/17 Potential to Achieve Goals: Good    Frequency 7X/week   Barriers to discharge        Co-evaluation               AM-PAC PT "6 Clicks" Daily Activity  Outcome Measure Difficulty turning over in bed (including adjusting bedclothes, sheets and blankets)?: None Difficulty moving from lying on back to sitting on the side of the bed? : None Difficulty sitting down on and standing up from a chair with arms (e.g., wheelchair, bedside commode, etc,.)?: None Help needed moving to and from a bed to chair (including a wheelchair)?: A Little Help needed walking in hospital room?: A Little Help needed climbing 3-5 steps with a railing? : A Lot 6 Click Score: 20    End of Session Equipment Utilized During Treatment: Gait belt Activity Tolerance: Patient limited by fatigue Patient left: in chair;with call bell/phone within reach;with chair alarm set;with nursing/sitter in room Nurse Communication: Mobility status PT Visit Diagnosis: Other abnormalities of gait and mobility (R26.89);Unsteadiness on feet (R26.81);Muscle weakness (generalized) (M62.81)    Time: 2536-6440 PT Time Calculation (min) (ACUTE ONLY): 33 min   Charges:  PT G Codes:   PT G-Codes **NOT FOR INPATIENT CLASS** Functional Assessment Tool Used: AM-PAC 6 Clicks Basic Mobility Functional Limitation: Mobility: Walking and moving around Mobility: Walking and Moving Around Current Status ([id number]): At least 20 percent but less than 40 percent impaired, limited or restricted Mobility: Walking and Moving Around Goal Status [phone]): At least 1 percent but less than 20 percent impaired, limited or restricted    Renford Dills, SPT  Renford Dills 05/24/2017, 11:51 AM

## 2017-05-24 NOTE — Progress Notes (Signed)
[employer] [facility] [place] [facility]                                                                                                                                                                                  Patient Demographics   Roger [name], is a 51 y.o. male, DOB - July 20, 1966, JXB:[id number]  Admit date - 05/23/2017   Admitting Physician Enid Baas, MD  Outpatient Primary MD for the patient is Dan Maker, MD   LOS - 0  Subjective: Patient admitted with feeling of leaning towards the left side Also became nauseous earlier today   Review of Systems:   CONSTITUTIONAL: No documented fever. No fatigue, weakness. No weight gain, no weight loss.  EYES: No blurry or double vision.  ENT: No tinnitus. No postnasal drip. No redness of the oropharynx.  RESPIRATORY: No cough, no wheeze, no hemoptysis. No dyspnea.  CARDIOVASCULAR: No chest pain. No orthopnea. No palpitations. No syncope.  GASTROINTESTINAL: No nausea, no vomiting or diarrhea. No abdominal pain. No melena or hematochezia.  GENITOURINARY: No dysuria or hematuria.  ENDOCRINE: No polyuria or nocturia. No heat or cold intolerance.  HEMATOLOGY: No anemia. No bruising. No bleeding.  INTEGUMENTARY: No rashes. No lesions.  MUSCULOSKELETAL: No arthritis. No swelling. No gout.  NEUROLOGIC: Gait instability PSYCHIATRIC: No anxiety. No insomnia. No ADD.    Vitals:   Vitals:   05/23/17 2219 05/23/17 2345 05/24/17 0207 05/24/17 0445  BP: (!) 161/99 (!) 160/92 (!) 149/88 127/71  Pulse: 81 81 81 78  Resp:  Temp: 97.7 F (36.5 C) 98.3 F (36.8 C)  98 F (36.7 C)  TempSrc: Oral   Oral  SpO2: 96% 91% 95% 96%  Weight:      Height:        Wt Readings from Last 3 Encounters:  05/23/17 231 lb 14.4 oz (105.2 kg)  05/21/16 224 lb 12.8 oz (102 kg)  11/21/15 217 lb 3.2 oz (98.5 kg)    No intake or output data in the 24 hours ending 05/24/17 1340  Physical Exam:   GENERAL:  Pleasant-appearing in no apparent distress.  HEAD, EYES, EARS, NOSE AND THROAT: Atraumatic, normocephalic. Extraocular muscles are intact. Pupils equal and reactive to light. Sclerae anicteric. No conjunctival injection. No oro-pharyngeal erythema.  NECK: Supple. There is no jugular venous distention. No bruits, no lymphadenopathy, no thyromegaly.  HEART: Regular rate and rhythm,. No murmurs, no rubs, no clicks.  LUNGS: Clear to auscultation bilaterally. No rales or rhonchi. No wheezes.  ABDOMEN: Soft, flat, nontender, nondistended. Has good bowel sounds. No hepatosplenomegaly appreciated.  EXTREMITIES: No evidence of any cyanosis, clubbing, or peripheral edema.  +2  pedal and radial pulses bilaterally.  NEUROLOGIC: The patient is alert, awake, and oriented x3 with no focal motor or sensory deficits appreciated bilaterally.  SKIN: Moist and warm with no rashes appreciated.  Psych: Not anxious, depressed LN: No inguinal LN enlargement    Antibiotics   Anti-infectives    None      Medications   Scheduled Meds: . amLODipine  10 mg Oral Daily  . aspirin EC  81 mg Oral Daily  . atorvastatin  20 mg Oral q1800  . clopidogrel  75 mg Oral Daily  . enoxaparin (LOVENOX) injection  40 mg Subcutaneous Q24H  . furosemide  40 mg Oral Daily  . insulin aspart  0-5 Units Subcutaneous QHS  . insulin aspart  0-9 Units Subcutaneous TID WC  . insulin glargine  24 Units Subcutaneous Daily  . losartan  100 mg Oral Daily  . metFORMIN  1,000 mg Oral BID WC  . metoprolol succinate  50 mg Oral Daily  . niacin  1,500 mg Oral QHS   Continuous Infusions: PRN Meds:.acetaminophen **OR** acetaminophen, ondansetron **OR** ondansetron (ZOFRAN) IV   Data Review:   Micro Results No results found for this or any previous visit (from the past 240 hour(s)).  Radiology Reports Mr Brain Wo Contrast  Result Date: 05/23/2017 CLINICAL DATA:  LEFT leg weakness for 2 days, gait imbalance. LEFT mouth tingling.  History of hypertension, hyperlipidemia, diabetes and stroke. EXAM: MRI HEAD WITHOUT CONTRAST TECHNIQUE: Multiplanar, multiecho pulse sequences of the brain and surrounding structures were obtained without intravenous contrast. COMPARISON:  CT HEAD May 23, 2017 1222 hours and MRI of the head August 13, 2015 and CT angiogram head and neck May 14, 2015 FINDINGS: BRAIN: 2 punctate foci up foci of reduced diffusion LEFT medullary olive immediately below inferior cerebellar peduncle with low ADC values. RIGHT parietal reduced diffusion with normalized ADC values and, smaller component of T2 shine through. Patchy RIGHT parietal to internal capsule FLAIR T2 hyperintensities increased from prior MRI. A few additional scattered nonspecific supratentorial white matter FLAIR T2 hyperintensities are unchanged. Mild prominence of the ventricles and sulci, mild ex vacuo dilatation RIGHT trigone. No midline shift, mass effect or masses. VASCULAR: Attenuated LEFT V4 flow remaining major vascular flow voids maintained at skull base. SKULL AND UPPER CERVICAL SPINE: No abnormal sellar expansion. No suspicious calvarial bone marrow signal. Craniocervical junction maintained. SINUSES/ORBITS: Bilateral maxillary sinus mucosal thickening, LEFT maxillary mucosal retention cyst. LEFT frontal sinus air-fluid level. Mastoid air cells are well aerated. The included ocular globes and orbital contents are non-suspicious. OTHER: None. IMPRESSION: 1. Acute punctate infarcts LEFT medulla. Re- demonstrated LEFT vertebral artery occlusion. 2. Old RIGHT parietal white matter infarct. 3. Mild parenchymal brain volume loss for age. Minimal chronic small vessel ischemic disease. 4. Acute LEFT frontal sinusitis . Electronically Signed   By: Awilda Metro M.D.   On: 05/23/2017 16:51   US Carotid Bilateral  Result Date: 05/23/2017 CLINICAL DATA:  TIA. EXAM: BILATERAL CAROTID DUPLEX ULTRASOUND TECHNIQUE: Wallace Cullens scale imaging, color  Doppler and duplex ultrasound were performed of bilateral carotid and vertebral arteries in the neck. COMPARISON:  CT 05/23/2017 . FINDINGS: Criteria: Quantification of carotid stenosis is based on velocity parameters that correlate the residual internal carotid diameter with NASCET-based stenosis levels, using the diameter of the distal internal carotid lumen as the denominator for stenosis measurement. The following velocity measurements were obtained: RIGHT ICA:  58/15 cm/sec CCA:  82/12 cm/sec SYSTOLIC ICA/CCA RATIO:  0.7 DIASTOLIC ICA/CCA RATIO:  1.3 ECA:  116 cm/sec LEFT ICA:  51/20 cm/sec CCA:  90/12 cm/sec SYSTOLIC ICA/CCA RATIO:  0.6 DIASTOLIC ICA/CCA RATIO:  1.8 ECA:  114 cm/sec RIGHT CAROTID ARTERY: No significant atherosclerotic vascular disease. No flow limiting stenosis. RIGHT VERTEBRAL ARTERY:  Patent with antegrade flow. LEFT CAROTID ARTERY: No significant atherosclerotic vascular disease. No flow limiting stenosis. LEFT VERTEBRAL ARTERY:  Patent with antegrade flow. Exam limited due to patient motion. IMPRESSION: 1. No significant carotid atherosclerotic vascular disease. No flow limiting stenosis. 2. Vertebrals are patent with antegrade flow. Electronically Signed   By: Maisie Fus  Register   On: 05/23/2017 15:58   Ct Head Code Stroke Wo Contrast  Result Date: 05/23/2017 CLINICAL DATA:  Code stroke.  Subacute neuro deficits. EXAM: CT HEAD WITHOUT CONTRAST TECHNIQUE: Contiguous axial images were obtained from the base of the skull through the vertex without intravenous contrast. COMPARISON:  CT head 05/13/2015, MRI 05/14/2015, CTA 05/14/2015 FINDINGS: Brain: Chronic deep white matter infarct on the right. No acute infarct. Negative for hemorrhage or mass. Negative for hydrocephalus. Mild asymmetric enlargement right lateral ventricle due to chronic infarct in volume loss. Vascular: Negative for hyperdense vessel Skull: Negative Sinuses/Orbits: Negative Other: None ASPECTS ([place] Stroke Program  Early CT Score) - Ganglionic level infarction (caudate, lentiform nuclei, internal capsule, insula, M1-M3 cortex): 7 - Supraganglionic infarction (M4-M6 cortex): 3 Total score (0-10 with 10 being normal): 10 IMPRESSION: 1. 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 No acute intracranial abnormality 2. ASPECTS is 10 These results were called by telephone at the time of interpretation on 05/23/2017 at 12:38 pm to Dr. Sharman Cheek , who verbally acknowledged these results. Electronically Signed   By: Marlan Palau M.D.   On: 05/23/2017 12:39     CBC  Recent Labs Lab 05/23/17 1225 05/24/17 0550  WBC 11.7* 10.2  HGB 14.7 14.0  HCT 42.2 40.5  PLT 286 278  MCV 95.5 95.0  MCH 33.2 32.8  MCHC 34.8 34.5  RDW 13.8 13.8  LYMPHSABS 1.6  --   MONOABS 0.9  --   EOSABS 0.2  --   BASOSABS 0.1  --     Chemistries   Recent Labs Lab 05/23/17 1225 05/24/17 0550  NA 137 140  K 3.4* 3.9  CL 101 105  CO2 24 28  GLUCOSE 148* 162*  BUN 12 14  CREATININE 0.78 0.66  CALCIUM 8.8* 8.8*  AST 30  --   ALT 25  --   ALKPHOS 56  --   BILITOT 1.1  --    ------------------------------------------------------------------------------------------------------------------ estimated creatinine clearance is 127.7 mL/min (by C-G formula based on SCr of 0.66 mg/dL). ------------------------------------------------------------------------------------------------------------------  Recent Labs  05/23/17 1225  HGBA1C 6.8*   ------------------------------------------------------------------------------------------------------------------  Recent Labs  05/23/17 1225  CHOL 152  HDL 33*  LDLCALC 64  TRIG 161*  CHOLHDL 4.6   ------------------------------------------------------------------------------------------------------------------  Recent Labs  05/23/17 1225  TSH 1.475    ------------------------------------------------------------------------------------------------------------------  Recent Labs  05/23/17 1234  VITAMINB12 1,471*    Coagulation profile  Recent Labs Lab 05/23/17 1225  INR 0.93    No results for input(s): DDIMER in the last 72 hours.  Cardiac Enzymes  Recent Labs Lab 05/23/17 1225  TROPONINI <0.03   ------------------------------------------------------------------------------------------------------------------ Invalid input(s): POCBNP    Assessment & Plan   Patient is a 51 year old  With history of previous CVA presents with leaning towards one side  #1 Acute punctate infarcts LEFT medulla Leading to his current symptoms Patient has chronic left vertebral artery stenosis I will obtain MRA  of the brain Echocardiogram of the heart shows no evidence of thrombus Patient already on Plavix and I will add aspirin We'll change his cholesterol medication to high intensity statin b12 level elevated   #2 diabetes mellitus-on Lantus, metformin. Hold exenatide. -On sliding scale insulin - hemoglobin A1c 6.8 which shows stable blood sugar control  #3 hypertension-continue on Toprol, losartan and Norvasc  #4 hyperlipidemia-on statin.  Change to high intensity statin as described above due to acute stroke   #5 DVT prophylaxis-Lovenox       Code Status Orders        Start     Ordered   05/23/17 1641  Full code  Continuous     05/23/17 1640    Code Status History    Date Active Date Inactive Code Status Order ID Comments User Context   05/13/2015 11:45 PM 05/16/2015  3:18 PM Full Code [id number]  Oralia Manis, MD Inpatient           Consults  Neurology  DVT Prophylaxis  Lovenox  Lab Results  Component Value Date   PLT 278 05/24/2017     Time Spent in minutes    Greater than 50% of time spent in care coordination and counseling patient regarding the condition and plan of  care.   Auburn Bilberry M.D on 05/24/2017 at 1:40 PM  Between 7am to 6pm - Pager - [phone]  After 6pm go to [email or web address] - password [email or web address] [facility]  [facility] [place] [facility]   Office  [phone]

## 2017-05-24 NOTE — Progress Notes (Signed)
*  PRELIMINARY RESULTS* Echocardiogram 2D Echocardiogram has been performed.  Roger [name] 05/24/2017, 8:56 AM

## 2017-05-24 NOTE — Progress Notes (Signed)
VSS. Pt complaints of N/V earlier in the day. Zofran given and relieved symptoms. Later complained of dizziness off and on. Medication given. Relieved temporarily. Refused more medication for now, recommended rest and slow position changes. Pt resting comfortably not.

## 2017-05-24 NOTE — Consult Note (Signed)
Referring Physician: Allena Katz    Chief Complaint: Dizziness, left leg weakness  HPI: Roger [name] is an 51 y.o. male who reports that 2 days prior to admission he noted left leg weakness.  He was also a little off balance.  On yesterday at 10AM while at work the left leg weakness got worse making his gait worse. Also noted that he had some numbness on the left side of his mouth.  Developed vertigo and nausea.  With worsening symptoms patient presented for evaluation.  Initial NIHSS of 0.   Patient with a history of stroke 2 years ago in the right parietal region.  Patient with no residual deficits.  Had previously been on ASA and Plavix.  Taken off ASA about one year ago.      Date last known well: Date: 05/21/2017 Time last known well: Unable to determine tPA Given: No: Outside treatment window  Past Medical History:  Diagnosis Date  . B12 deficiency   . Diabetes mellitus without complication (HCC)    insulin-dependent  . Hyperlipidemia   . Hypertension   . Obesity   . OSA on CPAP   . RAD (reactive airway disease)   . Stroke [facility])     Past Surgical History:  Procedure Laterality Date  . EYE SURGERY Right    Corneal implant    Family History  Problem Relation Age of Onset  . Diabetes Mother    Social History:  reports that he has never smoked. He has never used smokeless tobacco. He reports that he does not drink alcohol or use drugs.  Allergies: No Known Allergies  Medications:  I have reviewed the patient's current medications. Prior to Admission:  Prescriptions Prior to Admission  Medication Sig Dispense Refill Last Dose  . amLODipine (NORVASC) 10 MG tablet Take 10 mg by mouth.   05/23/2017 at 0800  . clopidogrel (PLAVIX) 75 MG tablet Take 1 tablet (75 mg total) by mouth daily. 30 tablet 11 05/23/2017 at 0800  . Exenatide ER (BYDUREON) 2 MG SRER Inject 2 mg into the skin once a week. Pt uses on Sunday.   Past Week at Unknown time  . furosemide (LASIX) 20 MG tablet Take  40 mg by mouth daily.    05/23/2017 at 0800  . insulin glargine (LANTUS) 100 UNIT/ML injection Inject 20-25 Units into the skin 2 (two) times daily. Pt uses 25 units in the morning and 20 units at bedtime.   05/23/2017 at 0800  . losartan (COZAAR) 100 MG tablet Take 100 mg by mouth daily.   05/23/2017 at 0800  . metFORMIN (GLUCOPHAGE) 1000 MG tablet Take 1 tablet (1,000 mg total) by mouth 2 (two) times daily with a meal. 60 tablet 0 05/23/2017 at 0800  . metoprolol succinate (TOPROL-XL) 50 MG 24 hr tablet Take 50 mg by mouth daily.    05/23/2017 at 0800  . niacin (NIASPAN) 500 MG CR tablet Take 1,500 mg by mouth at bedtime.   05/22/2017 at 2000  . simvastatin (ZOCOR) 20 MG tablet Take 20 mg by mouth at bedtime.   05/22/2017 at 2000  . albuterol (PROVENTIL HFA;VENTOLIN HFA) 108 (90 BASE) MCG/ACT inhaler Inhale 2 puffs into the lungs every 6 (six) hours as needed for wheezing or shortness of breath.   prn at prn  . glucose blood (BAYER CONTOUR NEXT TEST) test strip use three times a day   Taking  . NEEDLE, DISP, 27 G 27G X 1/2" MISC Use with B12 injection   Taking  .  ONETOUCH DELICA LANCETS FINE MISC Inject into the skin.   Taking  . SYRINGE-NEEDLE, DISP, 3 ML (BD ECLIPSE SYRINGE) 25G X 1" 3 ML MISC Use with B12 injection   Taking   Scheduled: . amLODipine  10 mg Oral Daily  . aspirin EC  81 mg Oral Daily  . atorvastatin  20 mg Oral q1800  . clopidogrel  75 mg Oral Daily  . enoxaparin (LOVENOX) injection  40 mg Subcutaneous Q24H  . furosemide  40 mg Oral Daily  . insulin aspart  0-5 Units Subcutaneous QHS  . insulin aspart  0-9 Units Subcutaneous TID WC  . insulin glargine  24 Units Subcutaneous Daily  . losartan  100 mg Oral Daily  . metFORMIN  1,000 mg Oral BID WC  . metoprolol succinate  50 mg Oral Daily  . niacin  1,500 mg Oral QHS    ROS: History obtained from the patient  General ROS: negative for - chills, fatigue, fever, night sweats, weight gain or weight loss Psychological ROS:  negative for - behavioral disorder, hallucinations, memory difficulties, mood swings or suicidal ideation Ophthalmic ROS: negative for - blurry vision, double vision, eye pain or loss of vision ENT ROS: vertigo Allergy and Immunology ROS: negative for - hives or itchy/watery eyes Hematological and Lymphatic ROS: negative for - bleeding problems, bruising or swollen lymph nodes Endocrine ROS: negative for - galactorrhea, hair pattern changes, polydipsia/polyuria or temperature intolerance Respiratory ROS: negative for - cough, hemoptysis, shortness of breath or wheezing Cardiovascular ROS: negative for - chest pain, dyspnea on exertion, edema or irregular heartbeat Gastrointestinal ROS: nausea/vomiting Genito-Urinary ROS: negative for - dysuria, hematuria, incontinence or urinary frequency/urgency Musculoskeletal ROS: negative for - joint swelling or muscular weakness Neurological ROS: as noted in HPI Dermatological ROS: negative for rash and skin lesion changes  Physical Examination: Blood pressure 135/83, pulse 82, temperature 98.6 F (37 C), temperature source Oral, resp. rate 18, height 5\' 7"  (1.702 m), weight 105.2 kg (231 lb 14.4 oz), SpO2 94 %.  HEENT-  Normocephalic, no lesions, without obvious abnormality.  Normal external eye and conjunctiva.  Normal TM's bilaterally.  Normal auditory canals and external ears. Normal external nose, mucus membranes and septum.  Normal pharynx. Cardiovascular- S1, S2 normal, pulses palpable throughout   Lungs- chest clear, no wheezing, rales, normal symmetric air entry Abdomen- soft, non-tender; bowel sounds normal; no masses,  no organomegaly Extremities- no edema Lymph-no adenopathy palpable Musculoskeletal-no joint tenderness, deformity or swelling Skin-warm and dry, no hyperpigmentation, vitiligo, or suspicious lesions  Neurological Examination   Mental Status: Alert, oriented, thought content appropriate.  Speech fluent without evidence of  aphasia.  Able to follow 3 step commands without difficulty. Cranial Nerves: II: Discs flat bilaterally; Visual fields grossly normal, pupils equal, round, reactive to light and accommodation III,IV, VI: left ptosis, extra-ocular motions intact with nystagmus, on leftward gaze greater than on rightward gaze V,VII: decrease left NLF, facial light touch sensation decreased on the left VIII: hearing normal bilaterally IX,X: gag reflex present XI: bilateral shoulder shrug XII: midline tongue extension Motor: Right : Upper extremity   5/5    Left:     Upper extremity   5/5  Lower extremity   5/5     Lower extremity   5/5 Tone and bulk:normal tone throughout; no atrophy noted Sensory: Pinprick and light touch intact throughout, bilaterally Deep Tendon Reflexes: 2+ and symmetric with absent AJ's bilaterally Plantars: Right: mute   Left: upgoing Cerebellar: Normal finger-to-nose and normal heel-to-shin testing  bilaterally Gait: not tested due to severe vertigo    Laboratory Studies:  Basic Metabolic Panel:  Recent Labs Lab 05/23/17 1225 05/24/17 0550  NA 137 140  K 3.4* 3.9  CL 101 105  CO2 24 28  GLUCOSE 148* 162*  BUN 12 14  CREATININE 0.78 0.66  CALCIUM 8.8* 8.8*    Liver Function Tests:  Recent Labs Lab 05/23/17 1225  AST 30  ALT 25  ALKPHOS 56  BILITOT 1.1  PROT 7.9  ALBUMIN 4.3   No results for input(s): LIPASE, AMYLASE in the last 168 hours. No results for input(s): AMMONIA in the last 168 hours.  CBC:  Recent Labs Lab 05/23/17 1225 05/24/17 0550  WBC 11.7* 10.2  NEUTROABS 8.9*  --   HGB 14.7 14.0  HCT 42.2 40.5  MCV 95.5 95.0  PLT 286 278    Cardiac Enzymes:  Recent Labs Lab 05/23/17 1225  TROPONINI <0.03    BNP: Invalid input(s): POCBNP  CBG:  Recent Labs Lab 05/23/17 1933 05/23/17 2120 05/24/17 0736 05/24/17 1205 05/24/17 1638  GLUCAP 228* 136* 170* 205* 135*    Microbiology: No results found for this or any previous  visit.  Coagulation Studies:  Recent Labs  05/23/17 1225  LABPROT 12.4  INR 0.93    Urinalysis: No results for input(s): COLORURINE, LABSPEC, PHURINE, GLUCOSEU, HGBUR, BILIRUBINUR, KETONESUR, PROTEINUR, UROBILINOGEN, NITRITE, LEUKOCYTESUR in the last 168 hours.  Invalid input(s): APPERANCEUR  Lipid Panel:    Component Value Date/Time   CHOL 152 05/23/2017 1225   TRIG 273 (H) 05/23/2017 1225   HDL 33 (L) 05/23/2017 1225   CHOLHDL 4.6 05/23/2017 1225   VLDL 55 (H) 05/23/2017 1225   LDLCALC 64 05/23/2017 1225    HgbA1C:  Lab Results  Component Value Date   HGBA1C 6.8 (H) 05/23/2017    Urine Drug Screen:  No results found for: LABOPIA, COCAINSCRNUR, LABBENZ, AMPHETMU, THCU, LABBARB  Alcohol Level: No results for input(s): ETH in the last 168 hours.  Other results: EKG: sinus rhythm at 89 bpm  Imaging:Mr Mra Head Wo Contrast  Result Date: 05/24/2017 CLINICAL DATA:  Dizziness, follow-up stroke. History of occluded LEFT vertebral artery. EXAM: MRA HEAD WITHOUT CONTRAST TECHNIQUE: Angiographic images of the Circle of Willis were obtained using MRA technique without intravenous contrast. COMPARISON:  Carotid ultrasound May 23, 2017 and MRA head May 14, 2015 and CT angiogram of the head and neck May 14, 2015 FINDINGS: ANTERIOR CIRCULATION: Normal flow related enhancement of the included cervical, petrous, cavernous and supraclinoid internal carotid arteries. Patent anterior communicating artery. Patent anterior and middle cerebral arteries, including distal segments. Multifocal severe stenosis anterior cerebral artery's. Severe stenosis LEFT middle cerebral artery versus focally occluded. 10 mm moderate stenoses RIGHT middle cerebral artery. No aneurysm. POSTERIOR CIRCULATION: No flow related enhancement included LEFT vertebral artery though, proximal V4 segments not imaged. LEFT posterior-inferior cerebellar artery not present, unchanged. Severe stenosis mid basilar  artery. Basilar artery is patent, remaining main branch vessels. Severe tandem stenoses RIGHT posterior cerebral artery, moderate on the LEFT. Robust bilateral posterior communicating artery's demonstrated. No aneurysm. ANATOMIC VARIANTS: None. Source images and MIP images were reviewed. IMPRESSION: 1. 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 2. Multifocal severe stenoses cerebral artery's and mid basilar artery with potential focally occluded LEFT M1 segment, unchanged . 3. Given severity of findings, recommend repeat CT angiogram head and neck for further characterization. Electronically Signed   By: Awilda Metro M.D.   On: 05/24/2017 15:07  Mr Brain Wo Contrast  Result Date: 05/23/2017 CLINICAL DATA:  LEFT leg weakness for 2 days, gait imbalance. LEFT mouth tingling. History of hypertension, hyperlipidemia, diabetes and stroke. EXAM: MRI HEAD WITHOUT CONTRAST TECHNIQUE: Multiplanar, multiecho pulse sequences of the brain and surrounding structures were obtained without intravenous contrast. COMPARISON:  CT HEAD May 23, 2017 1222 hours and MRI of the head August 13, 2015 and CT angiogram head and neck May 14, 2015 FINDINGS: BRAIN: 2 punctate foci up foci of reduced diffusion LEFT medullary olive immediately below inferior cerebellar peduncle with low ADC values. RIGHT parietal reduced diffusion with normalized ADC values and, smaller component of T2 shine through. Patchy RIGHT parietal to internal capsule FLAIR T2 hyperintensities increased from prior MRI. A few additional scattered nonspecific supratentorial white matter FLAIR T2 hyperintensities are unchanged. Mild prominence of the ventricles and sulci, mild ex vacuo dilatation RIGHT trigone. No midline shift, mass effect or masses. VASCULAR: Attenuated LEFT V4 flow remaining major vascular flow voids maintained at skull base. SKULL AND UPPER CERVICAL SPINE: No abnormal sellar expansion.  No suspicious calvarial bone marrow signal. Craniocervical junction maintained. SINUSES/ORBITS: Bilateral maxillary sinus mucosal thickening, LEFT maxillary mucosal retention cyst. LEFT frontal sinus air-fluid level. Mastoid air cells are well aerated. The included ocular globes and orbital contents are non-suspicious. OTHER: None. IMPRESSION: 1. Acute punctate infarcts LEFT medulla. Re- demonstrated LEFT vertebral artery occlusion. 2. Old RIGHT parietal white matter infarct. 3. Mild parenchymal brain volume loss for age. Minimal chronic small vessel ischemic disease. 4. Acute LEFT frontal sinusitis . Electronically Signed   By: Awilda Metro M.D.   On: 05/23/2017 16:51   US Carotid Bilateral  Result Date: 05/23/2017 CLINICAL DATA:  TIA. EXAM: BILATERAL CAROTID DUPLEX ULTRASOUND TECHNIQUE: Wallace Cullens scale imaging, color Doppler and duplex ultrasound were performed of bilateral carotid and vertebral arteries in the neck. COMPARISON:  CT 05/23/2017 . FINDINGS: Criteria: Quantification of carotid stenosis is based on velocity parameters that correlate the residual internal carotid diameter with NASCET-based stenosis levels, using the diameter of the distal internal carotid lumen as the denominator for stenosis measurement. The following velocity measurements were obtained: RIGHT ICA:  58/15 cm/sec CCA:  82/12 cm/sec SYSTOLIC ICA/CCA RATIO:  0.7 DIASTOLIC ICA/CCA RATIO:  1.3 ECA:  116 cm/sec LEFT ICA:  51/20 cm/sec CCA:  90/12 cm/sec SYSTOLIC ICA/CCA RATIO:  0.6 DIASTOLIC ICA/CCA RATIO:  1.8 ECA:  114 cm/sec RIGHT CAROTID ARTERY: No significant atherosclerotic vascular disease. No flow limiting stenosis. RIGHT VERTEBRAL ARTERY:  Patent with antegrade flow. LEFT CAROTID ARTERY: No significant atherosclerotic vascular disease. No flow limiting stenosis. LEFT VERTEBRAL ARTERY:  Patent with antegrade flow. Exam limited due to patient motion. IMPRESSION: 1. No significant carotid atherosclerotic vascular disease. No flow  limiting stenosis. 2. Vertebrals are patent with antegrade flow. Electronically Signed   By: Maisie Fus  Register   On: 05/23/2017 15:58   Ct Head Code Stroke Wo Contrast  Result Date: 05/23/2017 CLINICAL DATA:  Code stroke.  Subacute neuro deficits. EXAM: CT HEAD WITHOUT CONTRAST TECHNIQUE: Contiguous axial images were obtained from the base of the skull through the vertex without intravenous contrast. COMPARISON:  CT head 05/13/2015, MRI 05/14/2015, CTA 05/14/2015 FINDINGS: Brain: Chronic deep white matter infarct on the right. No acute infarct. Negative for hemorrhage or mass. Negative for hydrocephalus. Mild asymmetric enlargement right lateral ventricle due to chronic infarct in volume loss. Vascular: Negative for hyperdense vessel Skull: Negative Sinuses/Orbits: Negative Other: None ASPECTS (Alberta Stroke Program Early CT Score) - Ganglionic level infarction (caudate,  lentiform nuclei, internal capsule, insula, M1-M3 cortex): 7 - Supraganglionic infarction (M4-M6 cortex): 3 Total score (0-10 with 10 being normal): 10 IMPRESSION: 1. 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 No acute intracranial abnormality 2. ASPECTS is 10 These results were called by telephone at the time of interpretation on 05/23/2017 at 12:38 pm to Dr. Sharman Cheek , who verbally acknowledged these results. Electronically Signed   By: Marlan Palau M.D.   On: 05/23/2017 12:39    Assessment: 51 y.o. male presenting with dizziness and left sided complaints. MRI of the brain reviewed and shows acute punctate infarcts in the left medulla.  Likely secondary to small vessel disease.  Patient with multiple risk factors.  MRA shows multiple areas of bilateral stenosis and chronically occluded left vertebral and left M1.  Carotid dopplers show no evidence of hemodynamically significant stenosis.  Echocardiogram shows no cardiac source of emboli, no evidence of shunt but limited visualization with an EF of 55-60%.  A1c 6.8, LDL 64.  It  should be noted that patient had a TEE in 2016 that showed no evidence of PFO.  Cardiac monitor was unremarkable.    Stroke Risk Factors - diabetes mellitus, hypertension and sleep apnea and intracranial stenosis  Plan: 1. PT consult, OT consult, Speech consult 2. Prophylactic therapy-ASA , Plavix  daily 3. PRN Valium for vertigo 4. Telemetry monitoring 5. Frequent neuro checks 6. Follow up with Dr. Riley Nearing, on an outpatient basis   Thana Farr, MD Neurology [phone] 05/24/2017, 9:26 PM

## 2017-05-24 NOTE — Evaluation (Signed)
Occupational Therapy Evaluation Patient Details Name: Roger [name] MRN: [id number] DOB: 10-03-1965 Today's Date: 05/24/2017    History of Present Illness Pt. is a 51 y.o. male who was admitted to [facility] with an Acute Punctate Infarcts in the Medulla secondary to increased leaning. Pt. PMHx includes: DM, HTN, Hyperlipidemia, DVT, and B12 Deficiency.    Clinical Impression   Pt. Is a 51 y.o. Male who was admitted to [facility] with a Acute Punctate Infarcts in the Medulla. Pt. Keeps eyes closed initially, and throughout the session secondary to reports of dizziness, and nausea. Pt. Is able to open eyes intermittently, and scan his environment without dizziness. Pt. resides at home with his wife, and was independent with ADLs, and IADLs, was working full time. Pt. Presents with generalized weakness, dizziness, limited balance, and limited mobility. Pt. could benefit from skilled OT services for ADL training, and pt. education about home modification, work simplification strategies, and DME. Pt. Plans to return home with his wife, and his mother.    Follow Up Recommendations  No OT follow up    Equipment Recommendations       Recommendations for Other Services       Precautions / Restrictions Precautions Precautions: Fall Restrictions Weight Bearing Restrictions: No                                                    ADL either performed or assessed with clinical judgement   ADL Overall ADL's : Needs assistance/impaired Eating/Feeding: Set up (Pt. is able to complete independently, however wife assisted pt. today secondary to extreme dizziness.)   Grooming: Set up       Lower Body Bathing: Min guard   Upper Body Dressing : Set up   Lower Body Dressing: Min guard               Functional mobility during ADLs: Min guard       Vision Baseline Vision/History:  (Pt. keeping eyes closed throughout the session. secondary to dizziness, and nausea.. Pt. able  to open eyes intermittently wihout dizziness.) Vision Assessment?: Vision impaired- to be further tested in functional context     Perception     Praxis      Pertinent Vitals/Pain Pain Assessment: No/denies pain     Hand Dominance Right   Extremity/Trunk Assessment Upper Extremity Assessment Upper Extremity Assessment: Overall WFL for tasks assessed (Sensation, and [facility] skills were intact.)           Communication Communication Communication: No difficulties   Cognition Arousal/Alertness: Lethargic Behavior During Therapy: WFL for tasks assessed/performed Overall Cognitive Status: Within Functional Limits for tasks assessed                                     General Comments       Exercises     Shoulder Instructions      Home Living Family/patient expects to be discharged to:: Private residence Living Arrangements: Spouse/significant other;Other relatives Available Help at Discharge: Family Type of Home: House Home Access: Stairs to enter [employer] of Steps: 1 Entrance Stairs-Rails: Right Home Layout: Two level Alternate Level Stairs-Number of Steps: 16   Bathroom Shower/Tub: Producer, television/film/video: Standard     Home Equipment: None  Prior Functioning/Environment Level of Independence: Independent        Comments: Pt. was independent with ADLs, IADLs. Pt. was wiorking fulltime in programming.        OT Problem List: Decreased strength;Decreased activity tolerance;Impaired vision/perception;Impaired balance (sitting and/or standing)      OT Treatment/Interventions: Self-care/ADL training;Therapeutic exercise;Patient/family education;Therapeutic activities;DME and/or AE instruction    OT Goals(Current goals can be found in the care plan section) Acute Rehab OT Goals Patient Stated Goal: To return home OT Goal Formulation: With patient Potential to Achieve Goals: Good  OT Frequency: Min 2X/week    Barriers to D/C:            Co-evaluation              AM-PAC PT "6 Clicks" Daily Activity     Outcome Measure Help from another person eating meals?: None Help from another person taking care of personal grooming?: A Little Help from another person toileting, which includes using toliet, bedpan, or urinal?: A Little Help from another person bathing (including washing, rinsing, drying)?: A Little Help from another person to put on and taking off regular upper body clothing?: None Help from another person to put on and taking off regular lower body clothing?: A Little 6 Click Score: 20   End of Session    Activity Tolerance: Patient tolerated treatment well Patient left: in bed;with call bell/phone within reach;with bed alarm set;with family/visitor present  OT Visit Diagnosis: Muscle weakness (generalized) (M62.81);Unsteadiness on feet (R26.81)                Time: 9811-9147 OT Time Calculation (min): 22 min Charges:  OT General Charges $OT Visit: 1 Visit OT Evaluation $OT Eval Moderate Complexity: 1 Mod G-Codes: OT G-codes **NOT FOR INPATIENT CLASS** Functional Limitation: Self care Self Care Current Status (W2956): At least 1 percent but less than 20 percent impaired, limited or restricted Self Care Goal Status (O1308): 0 percent impaired, limited or restricted   Roger Messier, MS, OTR/L   Roger Messier, MS, OTR/L 05/24/2017, 4:08 PM

## 2017-05-25 DIAGNOSIS — I639 Cerebral infarction, unspecified: Secondary | ICD-10-CM

## 2017-05-25 LAB — HIV ANTIBODY (ROUTINE TESTING W REFLEX): HIV SCREEN 4TH GENERATION: NONREACTIVE

## 2017-05-25 LAB — GLUCOSE, CAPILLARY: GLUCOSE-CAPILLARY: 189 mg/dL — AB (ref 65–99)

## 2017-05-25 MED ORDER — ASPIRIN 81 MG PO TBEC: 81.0000 mg | DELAYED_RELEASE_TABLET | Freq: Every day | ORAL | 0 refills | Status: AC

## 2017-05-25 MED ORDER — ATORVASTATIN CALCIUM 20 MG PO TABS: 20.0000 mg | ORAL_TABLET | Freq: Every day | ORAL | 0 refills | Status: AC

## 2017-05-25 MED ORDER — INSULIN GLARGINE 100 UNIT/ML ~~LOC~~ SOLN: 24.0000 [IU] | Freq: Every day | SUBCUTANEOUS | 11 refills | Status: AC

## 2017-05-25 NOTE — Progress Notes (Addendum)
NIH-0. Neuro checks WNL's with pt having eye disease that affects his pupil reponse. Reports steady gait with less dizziness at intervals. Wife at bedside. Oral and written AVS instructions given with stated understanding.  HH referral for PT done. Has walker to take home.

## 2017-05-25 NOTE — Progress Notes (Signed)
Physical Therapy Treatment Patient Details Name: Roger [name] MRN: [id number] DOB: 1966-08-19 Today's Date: 05/25/2017    History of Present Illness Pt. is a 51 y.o. male who was admitted to [facility] with an Acute Punctate Infarct secondary to increased leaning. Pt. PMHx includes: DM, HTN, Hyperlipidemia, DVT, and B12 Deficiency.     PT Comments    Pt awaiting discharge but ready for session.  Bed mobility without assist.  Pt initially stood and needed to be re-directed due to inattentiveness to balance deficits.  Returned to bed and he was then able to ambulate 81' with turn and min assist x 1.  He continues to lean to left.  Wife was instructed at this time for proper guarding and safety techniques along with general safety concerns and home management.  They were able to ambulate together an additional 120' with turns.  At times he needed verbal cues to stop gait and to fix lean and increase step height.  Pt has an entry into his home with no stairs and has a set up on first level where he can stay.  Stairs in home have only one rail.  Pt and wife encouraged to stay on 1st floor until strength and balance can be further addressed during therapy sessions after discharge.  Due to balance and general weakness he remains a high fall risk but pt and wife feel confident in discharge plan.  Encouraged to use walker at all times upon discharge.   Follow Up Recommendations  Outpatient PT     Equipment Recommendations  Rolling walker with 5" wheels    Recommendations for Other Services       Precautions / Restrictions Precautions Precautions: Fall Precaution Comments: Pt demonstrates balance deficits Restrictions Weight Bearing Restrictions: No    Mobility  Bed Mobility   Bed Mobility: Supine to Sit     Supine to sit: Independent     General bed mobility comments: Pt able to rise from supine to seated EOB with proper technique, no assist or cues needed.   Transfers Overall transfer  level: Needs assistance Equipment used: Rolling walker (2 wheeled) Transfers: Sit to/from Stand Sit to Stand: Min assist;Supervision         General transfer comment: verbal cues for hand placements and to slow down  Ambulation/Gait Ambulation/Gait assistance: Min assist;Min guard Ambulation Distance (Feet): 140 Feet Assistive device: Rolling walker (2 wheeled) Gait Pattern/deviations: Step-through pattern;Staggering left   Gait velocity interpretation: Below normal speed for age/gender General Gait Details: Left lean with gait.  Requries verbal cues to slow down and correct lean.  Relies heavily on RW for support.   Stairs            Wheelchair Mobility    Modified Rankin (Stroke Patients Only)       Balance   Sitting-balance support: Feet supported Sitting balance-Leahy Scale: Good       Standing balance-Leahy Scale: Poor Standing balance comment: Pt able to stand at EOB with RW with supervision. Pt was able to initially keep upright/centered posture, however gradually started to lean to the L.                             Cognition Arousal/Alertness: Awake/alert Behavior During Therapy: WFL for tasks assessed/performed Overall Cognitive Status: Within Functional Limits for tasks assessed  Exercises      General Comments        Pertinent Vitals/Pain Pain Assessment: No/denies pain    Home Living                      Prior Function            PT Goals (current goals can now be found in the care plan section) Progress towards PT goals: Progressing toward goals    Frequency    7X/week      PT Plan Current plan remains appropriate    Co-evaluation              AM-PAC PT "6 Clicks" Daily Activity  Outcome Measure  Difficulty turning over in bed (including adjusting bedclothes, sheets and blankets)?: None Difficulty moving from lying on back to sitting on  the side of the bed? : None Difficulty sitting down on and standing up from a chair with arms (e.g., wheelchair, bedside commode, etc,.)?: A Little Help needed moving to and from a bed to chair (including a wheelchair)?: A Little Help needed walking in hospital room?: A Little Help needed climbing 3-5 steps with a railing? : A Lot 6 Click Score: 19    End of Session Equipment Utilized During Treatment: Gait belt Activity Tolerance: Patient tolerated treatment well Patient left: in bed;with call bell/phone within reach;with family/visitor present         Time: 1610-9604 PT Time Calculation (min) (ACUTE ONLY): 23 min  Charges:  $Gait Training: 8-22 mins                    G Codes:       Danielle Dess, PTA 05/25/17, 9:44 AM

## 2017-05-25 NOTE — Care Management Note (Signed)
Case Management Note  Patient Details  Name: Jonmarc Bodkin MRN: [id number] Date of Birth: [birth year]  Subjective/Objective:      Discussed discharge planning with Mr Kerstein's wife. She chose [facility] to provide both a RW and HH-PT. Shaune Leeks at [facility] was update and will deliver a RW to Mr Ginsberg in his [facility] room today prior to discharge.               Action/Plan:   Expected Discharge Date:  05/25/17               Expected Discharge Plan:  Home w Home Health Services  In-House Referral:  NA  Discharge planning Services  CM Consult  Post Acute Care Choice:  Home Health Choice offered to:  Spouse, Patient  DME Arranged:  Dan Humphreys DME Agency:  [facility]  HH Arranged:  [facility] Agency:  [facility]  Status of Service:  Completed, signed off  If discussed at Long Length of Stay Meetings, dates discussed:    Additional Comments:  Perlie Scheuring A, RN 05/25/2017, 8:39 AM

## 2017-05-25 NOTE — Consult Note (Addendum)
Referring Physician: Posey Pronto    Chief Complaint: Dizziness, left leg weakness  Still has non positional vertigo in setting of the stroke, but improved speech   Date last known well: Date: 05/21/2017 Time last known well: Unable to determine tPA Given: No: Outside treatment window  Past Medical History:  Diagnosis Date  . B12 deficiency   . Diabetes mellitus without complication (HCC)    insulin-dependent  . Hyperlipidemia   . Hypertension   . Obesity   . OSA on CPAP   . RAD (reactive airway disease)   . Stroke [facility])     Past Surgical History:  Procedure Laterality Date  . EYE SURGERY Right    Corneal implant    Family History  Problem Relation Age of Onset  . Diabetes Mother    Social History:  reports that he has never smoked. He has never used smokeless tobacco. He reports that he does not drink alcohol or use drugs.  Allergies: No Known Allergies  Medications:  I have reviewed the patient's current medications. Prior to Admission:  Prescriptions Prior to Admission  Medication Sig Dispense Refill Last Dose  . amLODipine (NORVASC) 10 MG tablet Take 10 mg by mouth.   05/23/2017 at 0800  . clopidogrel (PLAVIX) 75 MG tablet Take 1 tablet (75 mg total) by mouth daily. 30 tablet 11 05/23/2017 at 0800  . Exenatide ER (BYDUREON) 2 MG SRER Inject 2 mg into the skin once a week. Pt uses on Sunday.   Past Week at Unknown time  . furosemide (LASIX) 20 MG tablet Take 40 mg by mouth daily.    05/23/2017 at 0800  . insulin glargine (LANTUS) 100 UNIT/ML injection Inject 20-25 Units into the skin 2 (two) times daily. Pt uses 25 units in the morning and 20 units at bedtime.   05/23/2017 at 0800  . losartan (COZAAR) 100 MG tablet Take 100 mg by mouth daily.   05/23/2017 at 0800  . metFORMIN (GLUCOPHAGE) 1000 MG tablet Take 1 tablet (1,000 mg total) by mouth 2 (two) times daily with a meal. 60 tablet 0 05/23/2017 at 0800  . metoprolol succinate (TOPROL-XL) 50 MG 24 hr tablet Take 50 mg by  mouth daily.    05/23/2017 at 0800  . niacin (NIASPAN) 500 MG CR tablet Take 1,500 mg by mouth at bedtime.   05/22/2017 at 2000  . simvastatin (ZOCOR) 20 MG tablet Take 20 mg by mouth at bedtime.   05/22/2017 at 2000  . albuterol (PROVENTIL HFA;VENTOLIN HFA) 108 (90 BASE) MCG/ACT inhaler Inhale 2 puffs into the lungs every 6 (six) hours as needed for wheezing or shortness of breath.   prn at prn  . glucose blood (BAYER CONTOUR NEXT TEST) test strip use three times a day   Taking  . NEEDLE, DISP, 27 G 27G X 1/2" MISC Use with B12 injection   Taking  . ONETOUCH DELICA LANCETS FINE MISC Inject into the skin.   Taking  . SYRINGE-NEEDLE, DISP, 3 ML (BD ECLIPSE SYRINGE) 25G X 1" 3 ML MISC Use with B12 injection   Taking   Scheduled: . amLODipine  10 mg Oral Daily  . aspirin EC  81 mg Oral Daily  . atorvastatin  20 mg Oral q1800  . clopidogrel  75 mg Oral Daily  . enoxaparin (LOVENOX) injection  40 mg Subcutaneous Q24H  . furosemide  40 mg Oral Daily  . insulin aspart  0-5 Units Subcutaneous QHS  . insulin aspart  0-9 Units Subcutaneous TID WC  .  insulin glargine  24 Units Subcutaneous Daily  . losartan  100 mg Oral Daily  . metFORMIN  1,000 mg Oral BID WC  . metoprolol succinate  50 mg Oral Daily  . niacin  1,500 mg Oral QHS    ROS: History obtained from the patient  General ROS: negative for - chills, fatigue, fever, night sweats, weight gain or weight loss Psychological ROS: negative for - behavioral disorder, hallucinations, memory difficulties, mood swings or suicidal ideation Ophthalmic ROS: negative for - blurry vision, double vision, eye pain or loss of vision ENT ROS: vertigo Allergy and Immunology ROS: negative for - hives or itchy/watery eyes Hematological and Lymphatic ROS: negative for - bleeding problems, bruising or swollen lymph nodes Endocrine ROS: negative for - galactorrhea, hair pattern changes, polydipsia/polyuria or temperature intolerance Respiratory ROS: negative for  - cough, hemoptysis, shortness of breath or wheezing Cardiovascular ROS: negative for - chest pain, dyspnea on exertion, edema or irregular heartbeat Gastrointestinal ROS: nausea/vomiting Genito-Urinary ROS: negative for - dysuria, hematuria, incontinence or urinary frequency/urgency Musculoskeletal ROS: negative for - joint swelling or muscular weakness Neurological ROS: as noted in HPI Dermatological ROS: negative for rash and skin lesion changes  Physical Examination: Blood pressure (!) 146/85, pulse 77, temperature 98.4 F (36.9 C), resp. rate 20, height _0  (1.702 m), weight 105.2 kg (231 lb 14.4 oz), SpO2 95 %.  HEENT-  Normocephalic, no lesions, without obvious abnormality.  Normal external eye and conjunctiva.  Normal TM's bilaterally.  Normal auditory canals and external ears. Normal external nose, mucus membranes and septum.  Normal pharynx. Cardiovascular- S1, S2 normal, pulses palpable throughout   Lungs- chest clear, no wheezing, rales, normal symmetric air entry Abdomen- soft, non-tender; bowel sounds normal; no masses,  no organomegaly Extremities- no edema Lymph-no adenopathy palpable Musculoskeletal-no joint tenderness, deformity or swelling Skin-warm and dry, no hyperpigmentation, vitiligo, or suspicious lesions  Neurological Examination   Mental Status: Alert, oriented, thought content appropriate.  Speech fluent without evidence of aphasia.  Able to follow 3 step commands without difficulty. Cranial Nerves: II: Discs flat bilaterally; Visual fields grossly normal, pupils equal, round, reactive to light and accommodation III,IV, VI: left ptosis, extra-ocular motions intact with nystagmus, on leftward gaze greater than on rightward gaze V,VII: decrease left NLF, facial light touch sensation decreased on the left VIII: hearing normal bilaterally IX,X: gag reflex present XI: bilateral shoulder shrug XII: midline tongue extension Motor: Right : Upper extremity    5/5    Left:     Upper extremity   5/5  Lower extremity   5/5     Lower extremity   5/5 Tone and bulk:normal tone throughout; no atrophy noted Sensory: Pinprick and light touch intact throughout, bilaterally Deep Tendon Reflexes: 2+ and symmetric with absent AJ's bilaterally Plantars: Right: mute   Left: upgoing Cerebellar: Normal finger-to-nose and normal heel-to-shin testing bilaterally Gait: not tested due to severe vertigo    Laboratory Studies:  Basic Metabolic Panel:  Recent Labs Lab 05/23/17 1225 05/24/17 0550  NA 137 140  K 3.4* 3.9  CL 101 105  CO2 24 28  GLUCOSE 148* 162*  BUN 12 14  CREATININE 0.78 0.66  CALCIUM 8.8* 8.8*    Liver Function Tests:  Recent Labs Lab 05/23/17 1225  AST 30  ALT 25  ALKPHOS 56  BILITOT 1.1  PROT 7.9  ALBUMIN 4.3   No results for input(s): LIPASE, AMYLASE in the last 168 hours. No results for input(s): AMMONIA in the last 168 hours.  CBC:  Recent Labs Lab 05/23/17 1225 05/24/17 0550  WBC 11.7* 10.2  NEUTROABS 8.9*  --   HGB 14.7 14.0  HCT 42.2 40.5  MCV 95.5 95.0  PLT 286 278    Cardiac Enzymes:  Recent Labs Lab 05/23/17 1225  TROPONINI <0.03    BNP: Invalid input(s): POCBNP  CBG:  Recent Labs Lab 05/24/17 0736 05/24/17 1205 05/24/17 1638 05/24/17 2205 05/25/17 0744  GLUCAP 170* 205* 135* 252* 189*    Microbiology: No results found for this or any previous visit.  Coagulation Studies:  Recent Labs  05/23/17 1225  LABPROT 12.4  INR 0.93    Urinalysis: No results for input(s): COLORURINE, LABSPEC, PHURINE, GLUCOSEU, HGBUR, BILIRUBINUR, KETONESUR, PROTEINUR, UROBILINOGEN, NITRITE, LEUKOCYTESUR in the last 168 hours.  Invalid input(s): APPERANCEUR  Lipid Panel:    Component Value Date/Time   CHOL 152 05/23/2017 1225   TRIG 273 (H) 05/23/2017 1225   HDL 33 (L) 05/23/2017 1225   CHOLHDL 4.6 05/23/2017 1225   VLDL 55 (H) 05/23/2017 1225   LDLCALC 64 05/23/2017 1225    HgbA1C:   Lab Results  Component Value Date   HGBA1C 6.8 (H) 05/23/2017    Urine Drug Screen:  No results found for: LABOPIA, COCAINSCRNUR, LABBENZ, AMPHETMU, THCU, LABBARB  Alcohol Level: No results for input(s): ETH in the last 168 hours.  Other results: EKG: sinus rhythm at 89 bpm  Imaging:Mr Mra Head Wo Contrast  Result Date: 05/24/2017 CLINICAL DATA:  Dizziness, follow-up stroke. History of occluded LEFT vertebral artery. EXAM: MRA HEAD WITHOUT CONTRAST TECHNIQUE: Angiographic images of the Circle of Willis were obtained using MRA technique without intravenous contrast. COMPARISON:  Carotid ultrasound May 23, 2017 and MRA head May 14, 2015 and CT angiogram of the head and neck May 14, 2015 FINDINGS: ANTERIOR CIRCULATION: Normal flow related enhancement of the included cervical, petrous, cavernous and supraclinoid internal carotid arteries. Patent anterior communicating artery. Patent anterior and middle cerebral arteries, including distal segments. Multifocal severe stenosis anterior cerebral artery's. Severe stenosis LEFT middle cerebral artery versus focally occluded. 10 mm moderate stenoses RIGHT middle cerebral artery. No aneurysm. POSTERIOR CIRCULATION: No flow related enhancement included LEFT vertebral artery though, proximal V4 segments not imaged. LEFT posterior-inferior cerebellar artery not present, unchanged. Severe stenosis mid basilar artery. Basilar artery is patent, remaining main branch vessels. Severe tandem stenoses RIGHT posterior cerebral artery, moderate on the LEFT. Robust bilateral posterior communicating artery's demonstrated. No aneurysm. ANATOMIC VARIANTS: None. Source images and MIP images were reviewed. IMPRESSION: 1. 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 2. Multifocal severe stenoses cerebral artery's and mid basilar artery with potential focally occluded LEFT M1 segment, unchanged . 3. Given severity of  findings, recommend repeat CT angiogram head and neck for further characterization. Electronically Signed   By: Elon Alas M.D.   On: 05/24/2017 15:07   Mr Brain Wo Contrast  Result Date: 05/23/2017 CLINICAL DATA:  LEFT leg weakness for 2 days, gait imbalance. LEFT mouth tingling. History of hypertension, hyperlipidemia, diabetes and stroke. EXAM: MRI HEAD WITHOUT CONTRAST TECHNIQUE: Multiplanar, multiecho pulse sequences of the brain and surrounding structures were obtained without intravenous contrast. COMPARISON:  CT HEAD May 23, 2017 1222 hours and MRI of the head August 13, 2015 and CT angiogram head and neck May 14, 2015 FINDINGS: BRAIN: 2 punctate foci up foci of reduced diffusion LEFT medullary olive immediately below inferior cerebellar peduncle with low ADC values. RIGHT parietal reduced diffusion with normalized ADC values and, smaller component  of T2 shine through. Patchy RIGHT parietal to internal capsule FLAIR T2 hyperintensities increased from prior MRI. A few additional scattered nonspecific supratentorial white matter FLAIR T2 hyperintensities are unchanged. Mild prominence of the ventricles and sulci, mild ex vacuo dilatation RIGHT trigone. No midline shift, mass effect or masses. VASCULAR: Attenuated LEFT V4 flow remaining major vascular flow voids maintained at skull base. SKULL AND UPPER CERVICAL SPINE: No abnormal sellar expansion. No suspicious calvarial bone marrow signal. Craniocervical junction maintained. SINUSES/ORBITS: Bilateral maxillary sinus mucosal thickening, LEFT maxillary mucosal retention cyst. LEFT frontal sinus air-fluid level. Mastoid air cells are well aerated. The included ocular globes and orbital contents are non-suspicious. OTHER: None. IMPRESSION: 1. Acute punctate infarcts LEFT medulla. Re- demonstrated LEFT vertebral artery occlusion. 2. Old RIGHT parietal white matter infarct. 3. Mild parenchymal brain volume loss for age. Minimal chronic  small vessel ischemic disease. 4. Acute LEFT frontal sinusitis . Electronically Signed   By: Elon Alas M.D.   On: 05/23/2017 16:51   US Carotid Bilateral  Result Date: 05/23/2017 CLINICAL DATA:  TIA. EXAM: BILATERAL CAROTID DUPLEX ULTRASOUND TECHNIQUE: Pearline Cables scale imaging, color Doppler and duplex ultrasound were performed of bilateral carotid and vertebral arteries in the neck. COMPARISON:  CT 05/23/2017 . FINDINGS: Criteria: Quantification of carotid stenosis is based on velocity parameters that correlate the residual internal carotid diameter with NASCET-based stenosis levels, using the diameter of the distal internal carotid lumen as the denominator for stenosis measurement. The following velocity measurements were obtained: RIGHT ICA:  58/15 cm/sec CCA:  92/01 cm/sec SYSTOLIC ICA/CCA RATIO:  0.7 DIASTOLIC ICA/CCA RATIO:  1.3 ECA:  116 cm/sec LEFT ICA:  51/20 cm/sec CCA:  00/71 cm/sec SYSTOLIC ICA/CCA RATIO:  0.6 DIASTOLIC ICA/CCA RATIO:  1.8 ECA:  114 cm/sec RIGHT CAROTID ARTERY: No significant atherosclerotic vascular disease. No flow limiting stenosis. RIGHT VERTEBRAL ARTERY:  Patent with antegrade flow. LEFT CAROTID ARTERY: No significant atherosclerotic vascular disease. No flow limiting stenosis. LEFT VERTEBRAL ARTERY:  Patent with antegrade flow. Exam limited due to patient motion. IMPRESSION: 1. No significant carotid atherosclerotic vascular disease. No flow limiting stenosis. 2. Vertebrals are patent with antegrade flow. Electronically Signed   By: Marcello Moores  Register   On: 05/23/2017 15:58   Ct Head Code Stroke Wo Contrast  Result Date: 05/23/2017 CLINICAL DATA:  Code stroke.  Subacute neuro deficits. EXAM: CT HEAD WITHOUT CONTRAST TECHNIQUE: Contiguous axial images were obtained from the base of the skull through the vertex without intravenous contrast. COMPARISON:  CT head 05/13/2015, MRI 05/14/2015, CTA 05/14/2015 FINDINGS: Brain: Chronic deep white matter infarct on the right. No acute  infarct. Negative for hemorrhage or mass. Negative for hydrocephalus. Mild asymmetric enlargement right lateral ventricle due to chronic infarct in volume loss. Vascular: Negative for hyperdense vessel Skull: Negative Sinuses/Orbits: Negative Other: None ASPECTS ([place] Stroke Program Early CT Score) - Ganglionic level infarction (caudate, lentiform nuclei, internal capsule, insula, M1-M3 cortex): 7 - Supraganglionic infarction (M4-M6 cortex): 3 Total score (0-10 with 10 being normal): 10 IMPRESSION: 1. 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 No acute intracranial abnormality 2. ASPECTS is 10 These results were called by telephone at the time of interpretation on 05/23/2017 at 12:38 pm to Dr. Carrie Mew , who verbally acknowledged these results. Electronically Signed   By: Franchot Gallo M.D.   On: 05/23/2017 12:39    Assessment: 51 y.o. male presenting with dizziness and left sided complaints. MRI of the brain reviewed and shows acute punctate infarcts in the left medulla.  Likely  secondary to small vessel disease.  Patient with multiple risk factors.  MRA shows multiple areas of bilateral stenosis and chronically occluded left vertebral and left M1.  Carotid dopplers show no evidence of hemodynamically significant stenosis.  Echocardiogram shows no cardiac source of emboli, no evidence of shunt but limited visualization with an EF of 55-60%.  A1c 6.8, LDL 64.  It should be noted that patient had a TEE in 2016 that showed no evidence of PFO.  Cardiac monitor was unremarkable.    Stroke Risk Factors - diabetes mellitus, hypertension and sleep apnea and intracranial stenosis   Still non positional vertigo and feels like he is falling to L side Met with family and questions answered On dual anti platelet therapy given the intracranial stenosis.  Will possibly need rehab vs out pt PT Pt works at [employer] so suspect can go back if vertigo improves   05/25/2017, 10:15 AM

## 2017-05-25 NOTE — Progress Notes (Signed)
Discharged home to self/family care with [facility] services. Pt transported to private vehicle in transport chair to go home.

## 2017-05-28 NOTE — Discharge Summary (Signed)
Roger [name], is a 51 y.o. male  DOB 01/12/1966  MRN [id number].  Admission date:  05/23/2017  Admitting Physician  Enid Baas, MD  Discharge Date:  05/25/17  Primary MD  Dan Maker, MD  Recommendations for primary care physician for things to follow:   Discharge and follow-up with primary doctor in 1 week  Admission Diagnosis  TIA (transient ischemic attack) [G45.9] Left leg weakness [R29.898] Acute ischemic stroke [facility]) [I63.9]   Discharge Diagnosis  TIA (transient ischemic attack) [G45.9] Left leg weakness [R29.898] Acute ischemic stroke [facility]) [I63.9]   Active Problems:   TIA (transient ischemic attack)      Past Medical History:  Diagnosis Date  . B12 deficiency   . Diabetes mellitus without complication (HCC)    insulin-dependent  . Hyperlipidemia   . Hypertension   . Obesity   . OSA on CPAP   . RAD (reactive airway disease)   . Stroke [facility])     Past Surgical History:  Procedure Laterality Date  . EYE SURGERY Right    Corneal implant       History of present illness and  Hospital Course:     Kindly see H&P for history of present illness and admission details, please review complete Labs, Consult reports and Test reports for all details in brief  HPI  from the history and physical done on the day of admission 51 year old male patient admitted for dizziness and left leg weakness,, waited for evaluation of stroke.   Hospital Course  #1 dizziness, left-sided weakness secondary to acute infarct in left medulla.: Seen by neurology, she received aspirin. Patient MRA of the brain showed acute punctate infarct in left medulla secondary to small vessel disease, MRA of the brain showed bilateral stenosis, chronically occluded left vertebral artery. Echocardiogram showed no cardiac source of  emboli, no evidence of shunt. Ejection fraction 55-60%. Hemoglobin A1c 6.8./LDL 64. Patient received Valium for vertigo. Patient discharged home on the 29th. Added Plavix also to discharge medications, patient is advised to continue aspirin, Plavix, statins. Discharge home with rolling walker and home health physical therapy as per physical therapy recommendation. Changes as statins to high intensity statins at discharge.  #2: diabetes mellitus Type2;I Continue Lantus, metformin. #3. essential hypertension: Controlled continue Toprol, Ceftin, Norvasc.    Discharge Condition:stable   Follow UP      Discharge Instructions  and  Discharge Medications     Discharge Instructions    Face-to-face encounter (required for Medicare/Medicaid patients)    Complete by:  As directed    I [name] certify that this patient is under my care and that I, or a nurse practitioner or physician's assistant working with me, had a face-to-face encounter that meets the physician face-to-face encounter requirements with this patient on 05/25/2017. The encounter with the patient was in whole, or in part for the following medical condition(s) which is the primary reason for home health care  Acute stroke Diabetes mellitus type 2 Hyperlipidemia Hypertension   The encounter with the patient was in whole, or in part, for the following medical condition, which is the primary reason for home health care:  whole   I certify that, based on my findings, the following services are medically necessary home health services:  Physical therapy   Reason for Medically Necessary Home Health Services:  Therapy- Investment banker, operational, Teacher, early years/pre and Stair Training   My clinical findings support the need for the above services:   Unable to leave home safely without  assistance and/or assistive device Unsafe ambulation due to balance issues     Further, I certify that my clinical findings support that this patient is  homebound due to:  Unsafe ambulation due to balance issues   Home Health    Complete by:  As directed    To provide the following care/treatments:  PT     Allergies as of 05/25/2017   No Known Allergies     Medication List    STOP taking these medications   simvastatin 20 MG tablet Commonly known as:  ZOCOR     TAKE these medications   albuterol 108 (90 Base) MCG/ACT inhaler Commonly known as:  PROVENTIL HFA;VENTOLIN HFA Inhale 2 puffs into the lungs every 6 (six) hours as needed for wheezing or shortness of breath.   amLODipine 10 MG tablet Commonly known as:  NORVASC Take 10 mg by mouth.   aspirin 81 MG EC tablet Take 1 tablet (81 mg total) by mouth daily.   atorvastatin 20 MG tablet Commonly known as:  LIPITOR Take 1 tablet (20 mg total) by mouth daily at 6 PM.   BAYER CONTOUR NEXT TEST test strip Generic drug:  glucose blood use three times a day   BD ECLIPSE SYRINGE 25G X 1" 3 ML Misc Generic drug:  SYRINGE-NEEDLE (DISP) 3 ML Use with B12 injection   BYDUREON 2 MG Srer Generic drug:  Exenatide ER Inject 2 mg into the skin once a week. Pt uses on Sunday.   clopidogrel 75 MG tablet Commonly known as:  PLAVIX Take 1 tablet (75 mg total) by mouth daily.   furosemide 20 MG tablet Commonly known as:  LASIX Take 40 mg by mouth daily.   insulin glargine 100 UNIT/ML injection Commonly known as:  LANTUS Inject 0.24 mLs (24 Units total) into the skin daily. What changed:  how much to take  when to take this  additional instructions   losartan 100 MG tablet Commonly known as:  COZAAR Take 100 mg by mouth daily.   metFORMIN 1000 MG tablet Commonly known as:  GLUCOPHAGE Take 1 tablet (1,000 mg total) by mouth 2 (two) times daily with a meal.   metoprolol succinate 50 MG 24 hr tablet Commonly known as:  TOPROL-XL Take 50 mg by mouth daily.   NEEDLE (DISP) 27 G 27G X 1/2" Misc Use with B12 injection   niacin 500 MG CR tablet Commonly known as:   NIASPAN Take 1,500 mg by mouth at bedtime.   ONETOUCH DELICA LANCETS FINE Misc Inject into the skin.         Diet and Activity recommendation: See Discharge Instructions above   Consults obtained -neurology   Major procedures and Radiology Reports - PLEASE review detailed and final reports for all details, in brief -    Mr Maxine Glenn Head Wo Contrast  Result Date: 05/24/2017 CLINICAL DATA:  Dizziness, follow-up stroke. History of occluded LEFT vertebral artery. EXAM: MRA HEAD WITHOUT CONTRAST TECHNIQUE: Angiographic images of the Circle of Willis were obtained using MRA technique without intravenous contrast. COMPARISON:  Carotid ultrasound May 23, 2017 and MRA head May 14, 2015 and CT angiogram of the head and neck May 14, 2015 FINDINGS: ANTERIOR CIRCULATION: Normal flow related enhancement of the included cervical, petrous, cavernous and supraclinoid internal carotid arteries. Patent anterior communicating artery. Patent anterior and middle cerebral arteries, including distal segments. Multifocal severe stenosis anterior cerebral artery's. Severe stenosis LEFT middle cerebral artery versus focally occluded. 10 mm moderate stenoses RIGHT middle cerebral  artery. No aneurysm. POSTERIOR CIRCULATION: No flow related enhancement included LEFT vertebral artery though, proximal V4 segments not imaged. LEFT posterior-inferior cerebellar artery not present, unchanged. Severe stenosis mid basilar artery. Basilar artery is patent, remaining main branch vessels. Severe tandem stenoses RIGHT posterior cerebral artery, moderate on the LEFT. Robust bilateral posterior communicating artery's demonstrated. No aneurysm. ANATOMIC VARIANTS: None. Source images and MIP images were reviewed. IMPRESSION: 1. 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 2. Multifocal severe stenoses cerebral artery's and mid basilar artery with potential focally occluded LEFT M1  segment, unchanged . 3. Given severity of findings, recommend repeat CT angiogram head and neck for further characterization. Electronically Signed   By: Awilda Metro M.D.   On: 05/24/2017 15:07   Mr Brain Wo Contrast  Result Date: 05/23/2017 CLINICAL DATA:  LEFT leg weakness for 2 days, gait imbalance. LEFT mouth tingling. History of hypertension, hyperlipidemia, diabetes and stroke. EXAM: MRI HEAD WITHOUT CONTRAST TECHNIQUE: Multiplanar, multiecho pulse sequences of the brain and surrounding structures were obtained without intravenous contrast. COMPARISON:  CT HEAD May 23, 2017 1222 hours and MRI of the head August 13, 2015 and CT angiogram head and neck May 14, 2015 FINDINGS: BRAIN: 2 punctate foci up foci of reduced diffusion LEFT medullary olive immediately below inferior cerebellar peduncle with low ADC values. RIGHT parietal reduced diffusion with normalized ADC values and, smaller component of T2 shine through. Patchy RIGHT parietal to internal capsule FLAIR T2 hyperintensities increased from prior MRI. A few additional scattered nonspecific supratentorial white matter FLAIR T2 hyperintensities are unchanged. Mild prominence of the ventricles and sulci, mild ex vacuo dilatation RIGHT trigone. No midline shift, mass effect or masses. VASCULAR: Attenuated LEFT V4 flow remaining major vascular flow voids maintained at skull base. SKULL AND UPPER CERVICAL SPINE: No abnormal sellar expansion. No suspicious calvarial bone marrow signal. Craniocervical junction maintained. SINUSES/ORBITS: Bilateral maxillary sinus mucosal thickening, LEFT maxillary mucosal retention cyst. LEFT frontal sinus air-fluid level. Mastoid air cells are well aerated. The included ocular globes and orbital contents are non-suspicious. OTHER: None. IMPRESSION: 1. Acute punctate infarcts LEFT medulla. Re- demonstrated LEFT vertebral artery occlusion. 2. Old RIGHT parietal white matter infarct. 3. Mild parenchymal brain  volume loss for age. Minimal chronic small vessel ischemic disease. 4. Acute LEFT frontal sinusitis . Electronically Signed   By: Awilda Metro M.D.   On: 05/23/2017 16:51   US Carotid Bilateral  Result Date: 05/23/2017 CLINICAL DATA:  TIA. EXAM: BILATERAL CAROTID DUPLEX ULTRASOUND TECHNIQUE: Wallace Cullens scale imaging, color Doppler and duplex ultrasound were performed of bilateral carotid and vertebral arteries in the neck. COMPARISON:  CT 05/23/2017 . FINDINGS: Criteria: Quantification of carotid stenosis is based on velocity parameters that correlate the residual internal carotid diameter with NASCET-based stenosis levels, using the diameter of the distal internal carotid lumen as the denominator for stenosis measurement. The following velocity measurements were obtained: RIGHT ICA:  58/15 cm/sec CCA:  82/12 cm/sec SYSTOLIC ICA/CCA RATIO:  0.7 DIASTOLIC ICA/CCA RATIO:  1.3 ECA:  116 cm/sec LEFT ICA:  51/20 cm/sec CCA:  90/12 cm/sec SYSTOLIC ICA/CCA RATIO:  0.6 DIASTOLIC ICA/CCA RATIO:  1.8 ECA:  114 cm/sec RIGHT CAROTID ARTERY: No significant atherosclerotic vascular disease. No flow limiting stenosis. RIGHT VERTEBRAL ARTERY:  Patent with antegrade flow. LEFT CAROTID ARTERY: No significant atherosclerotic vascular disease. No flow limiting stenosis. LEFT VERTEBRAL ARTERY:  Patent with antegrade flow. Exam limited due to patient motion. IMPRESSION: 1. No significant carotid atherosclerotic vascular disease. No flow limiting stenosis.  2. Vertebrals are patent with antegrade flow. Electronically Signed   By: Maisie Fus  Register   On: 05/23/2017 15:58   Ct Head Code Stroke Wo Contrast  Result Date: 05/23/2017 CLINICAL DATA:  Code stroke.  Subacute neuro deficits. EXAM: CT HEAD WITHOUT CONTRAST TECHNIQUE: Contiguous axial images were obtained from the base of the skull through the vertex without intravenous contrast. COMPARISON:  CT head 05/13/2015, MRI 05/14/2015, CTA 05/14/2015 FINDINGS: Brain: Chronic deep white  matter infarct on the right. No acute infarct. Negative for hemorrhage or mass. Negative for hydrocephalus. Mild asymmetric enlargement right lateral ventricle due to chronic infarct in volume loss. Vascular: Negative for hyperdense vessel Skull: Negative Sinuses/Orbits: Negative Other: None ASPECTS ([place] Stroke Program Early CT Score) - Ganglionic level infarction (caudate, lentiform nuclei, internal capsule, insula, M1-M3 cortex): 7 - Supraganglionic infarction (M4-M6 cortex): 3 Total score (0-10 with 10 being normal): 10 IMPRESSION: 1. 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 No acute intracranial abnormality 2. ASPECTS is 10 These results were called by telephone at the time of interpretation on 05/23/2017 at 12:38 pm to Dr. Sharman Cheek , who verbally acknowledged these results. Electronically Signed   By: Marlan Palau M.D.   On: 05/23/2017 12:39    Micro Results    No results found for this or any previous visit (from the past 240 hour(s)).     Today   Subjective:   Gianno Volner today has no headache,no chest abdominal pain,no new weakness tingling or numbness, feels much better wants to go home today.   Objective:   Blood pressure (!) 146/85, pulse 77, temperature 98.4 F (36.9 C), resp. rate 20, height  (1.702 m), weight 105.2 kg (231 lb 14.4 oz), SpO2 95 %.  No intake or output data in the 24 hours ending 05/28/17 1415  Exam Awake Alert, Oriented x 3, No new F.N deficits, Normal affect [place].AT,PERRAL Supple Neck,No JVD, No cervical lymphadenopathy appriciated.  Symmetrical Chest wall movement, Good air movement bilaterally, CTAB RRR,No Gallops,Rubs or new Murmurs, No Parasternal Heave +ve B.Sounds, Abd Soft, Non tender, No organomegaly appriciated, No rebound -guarding or rigidity. No Cyanosis, Clubbing or edema, No new Rash or bruise  Data Review   CBC w Diff:  Lab Results  Component Value Date   WBC 10.2 05/24/2017   HGB 14.0 05/24/2017   HCT 40.5  05/24/2017   PLT 278 05/24/2017   LYMPHOPCT 14 05/23/2017   MONOPCT 8 05/23/2017   EOSPCT 2 05/23/2017   BASOPCT 1 05/23/2017    CMP:  Lab Results  Component Value Date   NA 140 05/24/2017   K 3.9 05/24/2017   CL 105 05/24/2017   CO2 28 05/24/2017   BUN 14 05/24/2017   CREATININE 0.66 05/24/2017   PROT 7.9 05/23/2017   ALBUMIN 4.3 05/23/2017   BILITOT 1.1 05/23/2017   ALKPHOS 56 05/23/2017   AST 30 05/23/2017   ALT 25 05/23/2017  .   Total Time in preparing paper work, data evaluation and todays exam - 35 minutes  Hakeem Frazzini M.D on 05/25/17/09/2016 at 2:15 PM    Note: This dictation was prepared with Dragon dictation along with smaller phrase technology. Any transcriptional errors that result from this process are unintentional.

## 2017-06-05 ENCOUNTER — Encounter: Payer: Self-pay | Admitting: Neurology

## 2017-06-05 ENCOUNTER — Ambulatory Visit (INDEPENDENT_AMBULATORY_CARE_PROVIDER_SITE_OTHER): Payer: Commercial Managed Care - PPO | Admitting: Neurology

## 2017-06-05 VITALS — BP 136/85 | HR 95 | Ht 67.0 in | Wt 227.6 lb

## 2017-06-05 DIAGNOSIS — I6381 Other cerebral infarction due to occlusion or stenosis of small artery: Secondary | ICD-10-CM

## 2017-06-05 NOTE — Progress Notes (Signed)
[facility]  PATIENT: Roger [name] DOB: [birth year]   REASON FOR VISIT: Follow up for embolic stroke HISTORY FROM: Patient    HISTORY OF PRESENT ILLNESS: Initial Consult 04/25/2015 PS: [name] is a 51 year old male whose had for the last 3 weeks subacute progressive paresthesias mostly in the left hand but to lesser degree in the right hand as well. He states this began about 3 weeks ago when he noticed weakness on his whole left side not just his hand this lasted only 10 minutes and recovered he subsequently had tickling sensation in his left hand and to lesser extent the right hand. Next a noticed a little heaviness on the right side and his right leg was buckling. He was seen in the emergency room at [facility] on 04/05/2015 and had unremarkable exam. Basic metabolic panel labs and CBC unremarkable. CT scan of the head which have personally reviewed was normal. He was subsequently seen by his primary physician and underwent a bunch of tests including carotid ultrasound, transthoracic echo and nuclear stress test all of which were normal. I do not have the results to review today. He had lab work done on 04/05/15 growing vitamin B12 which came slightly low-normal at 209. Patient has noticed for the last several days that he stumbles a lot. His gait is off balance. At times he can even drop objects from his left hand is he has so much tingling. This tingling seems to be more pronounced at night and at times he cannot sleep because of this. 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 He does have long-standing history of diabetes in which was poorly controlled in the past with lasted about an A1c being 7.9 but after being started on insulin he is doing better and more recently his fasting sugars have ranged from 90-120. He does have history of mild tingling in his feet but he does not feel that this has gotten worse. He  has not tried any specific medications for paresthesias in his hand. He denies history of any head injury, headache, neck pain, radicular pain. He does admit to significant fatigue over the last several months but denies any loss of vision, vertigo, diplopia, bladder urgency or incontinence. Update 05/23/2015 PS: He returns for follow-up after last visit 4 weeks ago. He was admitted to [facility] on 05/13/2015  with increased left hand weakness and numbness and 2 weeks ago had previous episode of transient speech difficulties. He was seen in the ER and that time and tone.Marland Kitchen MRI scan showed a right parietal deep white matter 2-3 cm subacute infarct extending into the posterior frontal lobe. 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 He was started on aspirin and Plavix together carotid ultrasound and transthoracic echo no source of embolism identified. He had outpatient cardiac monitor as well as transesophageal echocardiogram both of which did not reveal atrial fibrillation no cardiac source of embolism. Lipid panel and hemoglobin 1107 normal. He was seen by physical occupational speech therapy. He states his done well since discharge is tolerating aspirin and Plavix with minor bruising but no bleeding episodes. He is exercising and watching his diet has lost 15 pounds. He has also been getting B12 shots and feels it has overall that his tingling numbness as well as is walking better. Update 11/21/2015 PS: He returns for follow-up after last visit 6 months  ago. He continues to do well without recurrent stroke as he TIA symptoms. He states his blood pressure is well controlled at home and today it is 139/91. His fasting sugars have all been in the low 100 range and last him about an A1c was 7.1 a few months ago. He states his last lipid profile also was satisfactory. He does use CPAP at most nights but not consistently. He has not been as active as he  would like to be and would like tighter diet control and weight loss. He still remains on aspirin and Plavix and is tolerating it well without significant bruising or bleeding. He has no new complaints.  UPDATE 09/25/2017CM Mr.Radilla, 51 year old returns for follow-up. He has a history of stroke event which occurred in September 2016. He is currently on Plavix for secondary stroke prevention without further stroke or TIA symptoms. No bruising noted. In addition he has obstructive sleep apnea and has not been using CPAP. This is followed by his primary Dr. Meredeth Ide. He was made aware that this is a risk factor for stroke. Blood pressure is 137/94 the office today. He claims his diabetes is in better control. I do not have any results He has stopped exercising for a while but is now back to walking twice a day. He has a sedentary job. He remains on Zocor for hyperlipidemia without complaints of myalgias. He returns for reevaluation without any neurologic complaints. Most recent carotid Doppler 02/22/2016 was normal. He returns for reevaluation  UPDATE 06/05/2017 : He returns for follow-up after recent admission to [facility] for stroke in September 2018. I have reviewed hospital electronic medical records as well as personally reviewed imaging films. He presented there with dizziness, gait imbalance and left leg weakness. 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 MRA of the brain showed chronically occluded left vertebral artery and bilateral intracranial stenosis involving basilar artery in the midportion as well as potentially occluded left M1 segment which was unchanged compared with previous CT angiogram from 9/17,016. Transthoracic echo showed normal ejection fraction without cardiac source of embolism. Hemoglobin A1c was 6.8 and LDL cholesterol was 64 mg percent. The patient's feels he had this present stroke because a couple of weeks prior to his stroke his blood  sugars and blood pressure were both running quite high. He also admits to not being compliant with using his CPAP every night. Patient was previously on Plavix and aspirin 81 mg was added to current admission. Patient states he still has some residual numbness on the left side of his face left leg incoordination but overall seems to be gradually improving. He has started working part-time from his home  REVIEW OF SYSTEMS: Full 14 system review of systems performed and notable only for those listed, all others are neg:   Walking difficulty, imbalance, apnea, restless leg, double vision, dizziness and all other systems negative ALLERGIES: No Known Allergies  HOME MEDICATIONS: Outpatient Medications Prior to Visit  Medication Sig Dispense Refill  . albuterol (PROVENTIL HFA;VENTOLIN HFA) 108 (90 BASE) MCG/ACT inhaler Inhale 2 puffs into the lungs every 6 (six) hours as needed for wheezing or shortness of breath.    Marland Kitchen amLODipine (NORVASC) 10 MG tablet Take 10 mg by mouth.    Marland Kitchen aspirin EC 81 MG EC tablet Take 1 tablet (81 mg total) by mouth daily. 30 tablet 0  . atorvastatin (LIPITOR) 20 MG tablet Take 1 tablet (20 mg total) by mouth daily at 6 PM.  30 tablet 0  . clopidogrel (PLAVIX) 75 MG tablet Take 1 tablet (75 mg total) by mouth daily. 30 tablet 11  . Exenatide ER (BYDUREON) 2 MG SRER Inject 2 mg into the skin once a week. Pt uses on Sunday.    . furosemide (LASIX) 20 MG tablet Take 40 mg by mouth daily.     Marland Kitchen glucose blood (BAYER CONTOUR NEXT TEST) test strip use three times a day    . insulin glargine (LANTUS) 100 UNIT/ML injection Inject 0.24 mLs (24 Units total) into the skin daily. 10 mL 11  . losartan (COZAAR) 100 MG tablet Take 100 mg by mouth daily.    . metFORMIN (GLUCOPHAGE) 1000 MG tablet Take 1 tablet (1,000 mg total) by mouth 2 (two) times daily with a meal. 60 tablet 0  . metoprolol succinate (TOPROL-XL) 50 MG 24 hr tablet Take 50 mg by mouth daily.     Marland Kitchen NEEDLE, DISP, 27 G 27G X  1/2" MISC Use with B12 injection    . niacin (NIASPAN) 500 MG CR tablet Take 1,500 mg by mouth at bedtime.    Letta Pate DELICA LANCETS FINE MISC Inject into the skin.    Marland Kitchen SYRINGE-NEEDLE, DISP, 3 ML (BD ECLIPSE SYRINGE) 25G X 1" 3 ML MISC Use with B12 injection     No facility-administered medications prior to visit.     PAST MEDICAL HISTORY: Past Medical History:  Diagnosis Date  . B12 deficiency   . Diabetes mellitus without complication (HCC)    insulin-dependent  . Hyperlipidemia   . Hypertension   . Obesity   . OSA on CPAP   . RAD (reactive airway disease)   . Stroke [facility])     PAST SURGICAL HISTORY: Past Surgical History:  Procedure Laterality Date  . EYE SURGERY Right    Corneal implant    FAMILY HISTORY: Family History  Problem Relation Age of Onset  . Diabetes Mother     SOCIAL HISTORY: Social History   Social History  . Marital status: Single    Spouse name: N/A  . Number of children: N/A  . Years of education: N/A   Occupational History  . Not on file.   Social History Main Topics  . Smoking status: Never Smoker  . Smokeless tobacco: Never Used  . Alcohol use No  . Drug use: No  . Sexual activity: Not on file   Other Topics Concern  . Not on file   Social History Narrative   Caffeine none.     Right handed.   [employer]- programmer   Live in home   No children.       PHYSICAL EXAM  Vitals:   06/05/17 1010  BP: 136/85  Pulse: 95  Weight: 227 lb 9.6 oz (103.2 kg)  Height:  (1.702 m)   Body mass index is 35.65 kg/m.  General: Mildly obese middle-aged male in no acute distress obese male Head: head normocephalic and atraumatic.   Neck: supple with no carotid  bruits Cardiovascular: regular rate and rhythm, no murmurs Musculoskeletal: no deformity Skin:  no rash/petichiae Vascular:  Normal pulses all extremities Mental Status: Awake and fully alert. Oriented to place and time. Recent and remote memory intact. Attention  span, concentration and fund of knowledge appropriate. Mood and affect appropriate.  Cranial Nerves:  Pupils equal, briskly reactive to light. Extraocular movements full without nystagmus but secondary dysmetria on lateral gaze right greater than left.. Visual fields full to confrontation. Hearing intact. Facial  sensation   diminished on the left half of the face. Face, tongue, palate moves normally and symmetrically.  Motor: Normal bulk and tone. Normal strength in all tested extremity muscles. Sensory.: intact to touch , pinprick , position and vibratory sensation in the upper and lower extremities.  Coordination: Rapid alternating movements normal in all extremities. Finger-to-nose and heel-to-shin shows mild ataxia on the left Gait and Station: Arises from chair with slight difficulty. Stance is broad based. Gait mildly ataxic and leans to the left. Unsteady while standing on the left foot not able to perform tandem walking  Reflexes: 2+   and symmetric.   DIAGNOSTIC DATA (LABS, IMAGING, TESTING) - I reviewed patient records, labs, notes, testing and imaging myself where available.  Lab Results  Component Value Date   WBC 10.2 05/24/2017   HGB 14.0 05/24/2017   HCT 40.5 05/24/2017   MCV 95.0 05/24/2017   PLT 278 05/24/2017      Component Value Date/Time   NA 140 05/24/2017 0550   K 3.9 05/24/2017 0550   CL 105 05/24/2017 0550   CO2 28 05/24/2017 0550   GLUCOSE 162 (H) 05/24/2017 0550   BUN 14 05/24/2017 0550   CREATININE 0.66 05/24/2017 0550   CALCIUM 8.8 (L) 05/24/2017 0550   PROT 7.9 05/23/2017 1225   ALBUMIN 4.3 05/23/2017 1225   AST 30 05/23/2017 1225   ALT 25 05/23/2017 1225   ALKPHOS 56 05/23/2017 1225   BILITOT 1.1 05/23/2017 1225   GFRNONAA >60 05/24/2017 0550   GFRAA >60 05/24/2017 0550   Lab Results  Component Value Date   CHOL 152 05/23/2017   HDL 33 (L) 05/23/2017   LDLCALC 64 05/23/2017   TRIG 273 (H) 05/23/2017   CHOLHDL 4.6 05/23/2017   Lab Results    Component Value Date   HGBA1C 6.8 (H) 05/23/2017   Lab Results  Component Value Date   VITAMINB12 1,471 (H) 05/23/2017    ASSESSMENT AND PLAN 51 year male with  history of progressive left hand paresthesias as well as more recently gait and balance difficulties with frequent falls due to right MCA branch infarct 04/2015 likely due to intracranial atherosclerosis History  of B12 deficiency  . 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 Vascular risk factors of hypertension, diabetes, obesity, sleep apnea and intracranial atherosclerosis.      I had a long d/w patient and his wife about his recent  medullary stroke,intracranial stenosis risk for recurrent stroke/TIAs, personally independently reviewed imaging studies and stroke evaluation results and answered questions.Continue aspirin 81 mg daily and Plavix 75 mg daily x 3 months and then stop Plavix and change to aspirin 325 mg daily alone  for secondary stroke prevention and maintain strict control of hypertension with blood pressure goal below 130/90, diabetes with hemoglobin A1c goal below 6.5% and lipids with LDL cholesterol goal below 70 mg/dL. I also advised the patient to eat a healthy diet with plenty of whole grains, cereals, fruits and vegetables, exercise regularly and maintain ideal body weight .I advised the patient to be compliant with his CPAP and to use it every night. I do not believe repeat CT angiogram of the brain and neck is necessary as he has a known chronic left vertebral artery occlusion and the present left medullary infarct related to either lacunar disease or failure of collaterals. Followup in the future with my nurse practitioner in 3 months or call  earlier if necessary  [facility] [address] [place], [place] [address] [phone]

## 2017-06-05 NOTE — Patient Instructions (Signed)
I had a long d/w patient and his wife about his recent  medullary stroke,intracranial stenosis risk for recurrent stroke/TIAs, personally independently reviewed imaging studies and stroke evaluation results and answered questions.Continue aspirin 81 mg daily and Plavix 75 mg daily x 3 months and then stop Plavix and change to aspirin 325 mg daily alone  for secondary stroke prevention and maintain strict control of hypertension with blood pressure goal below 130/90, diabetes with hemoglobin A1c goal below 6.5% and lipids with LDL cholesterol goal below 70 mg/dL. I also advised the patient to eat a healthy diet with plenty of whole grains, cereals, fruits and vegetables, exercise regularly and maintain ideal body weight .I advised the patient to be compliant with his CPAP and to use it every night. I do not believe repeat CT angiogram of the brain and neck is necessary as he has a known chronic left vertebral artery occlusion and the present left medullary infarct related to either lacunar disease or failure of collaterals. Followup in the future with my nurse practitioner in 3 months or call earlier if necessary  Stroke Prevention Some medical conditions and behaviors are associated with an increased chance of having a stroke. You may prevent a stroke by making healthy choices and managing medical conditions. How can I reduce my risk of having a stroke?  Stay physically active. Get at least 30 minutes of activity on most or all days.  Do not smoke. It may also be helpful to avoid exposure to secondhand smoke.  Limit alcohol use. Moderate alcohol use is considered to be: ? No more than 2 drinks per day for men. ? No more than 1 drink per day for nonpregnant women.  Eat healthy foods. This involves: ? Eating 5 or more servings of fruits and vegetables a day. ? Making dietary changes that address high blood pressure (hypertension), high cholesterol, diabetes, or obesity.  Manage your cholesterol  levels. ? Making food choices that are high in fiber and low in saturated fat, trans fat, and cholesterol may control cholesterol levels. ? Take any prescribed medicines to control cholesterol as directed by your health care provider.  Manage your diabetes. ? Controlling your carbohydrate and sugar intake is recommended to manage diabetes. ? Take any prescribed medicines to control diabetes as directed by your health care provider.  Control your hypertension. ? Making food choices that are low in salt (sodium), saturated fat, trans fat, and cholesterol is recommended to manage hypertension. ? Ask your health care provider if you need treatment to lower your blood pressure. Take any prescribed medicines to control hypertension as directed by your health care provider. ? If you are 70-23 years of age, have your blood pressure checked every 3-5 years. If you are 100 years of age or older, have your blood pressure checked every year.  Maintain a healthy weight. ? Reducing calorie intake and making food choices that are low in sodium, saturated fat, trans fat, and cholesterol are recommended to manage weight.  Stop drug abuse.  Avoid taking birth control pills. ? Talk to your health care provider about the risks of taking birth control pills if you are over 2 years old, smoke, get migraines, or have ever had a blood clot.  Get evaluated for sleep disorders (sleep apnea). ? Talk to your health care provider about getting a sleep evaluation if you snore a lot or have excessive sleepiness.  Take medicines only as directed by your health care provider. ? For some people, aspirin or  blood thinners (anticoagulants) are helpful in reducing the risk of forming abnormal blood clots that can lead to stroke. If you have the irregular heart rhythm of atrial fibrillation, you should be on a blood thinner unless there is a good reason you cannot take them. ? Understand all your medicine instructions.  Make  sure that other conditions (such as anemia or atherosclerosis) are addressed. Get help right away if:  You have sudden weakness or numbness of the face, arm, or leg, especially on one side of the body.  Your face or eyelid droops to one side.  You have sudden confusion.  You have trouble speaking (aphasia) or understanding.  You have sudden trouble seeing in one or both eyes.  You have sudden trouble walking.  You have dizziness.  You have a loss of balance or coordination.  You have a sudden, severe headache with no known cause.  You have new chest pain or an irregular heartbeat. Any of these symptoms may represent a serious problem that is an emergency. Do not wait to see if the symptoms will go away. Get medical help at once. Call your local emergency services (911 in [place] Do not drive yourself to the hospital. This information is not intended to replace advice given to you by your health care provider. Make sure you discuss any questions you have with your health care provider. Document Released: 09/20/2004 Document Revised: 01/19/2016 Document Reviewed: 02/13/2013 Elsevier Interactive Patient Education  2017 [employer].

## 2017-06-10 ENCOUNTER — Telehealth: Payer: Self-pay | Admitting: Neurology

## 2017-06-10 NOTE — Telephone Encounter (Signed)
Dr. Pearlean Brownie will you do letter for pt to be out of work for 3 weeks. PT stated it was discuss when he saw you last week in office.

## 2017-06-10 NOTE — Telephone Encounter (Signed)
Patient requesting a letter stating he can return to work  3 weeks from today. Please call when ready for pickup.

## 2017-06-10 NOTE — Telephone Encounter (Signed)
Rn call patient back about needing a letter to be out of work for 3 weeks. Pt stated it was discuss in office visit last Wednesday. Rn stated Dr. Pearlean Brownie is in the hospital, and will be contacted. Pt verbalized understanding.

## 2017-06-10 NOTE — Telephone Encounter (Signed)
Yes ok to do it

## 2017-06-12 NOTE — Telephone Encounter (Signed)
Letter done for patient.Waitng for Dr. Pearlean BrownieSethi to sign.

## 2017-06-13 ENCOUNTER — Telehealth: Payer: Self-pay | Admitting: Neurology

## 2017-06-13 NOTE — Telephone Encounter (Signed)
Patient in lobby. Came to pick up letter. He wanted the letter to state that he is able to work from home. Requesting a new letter. Best call back [phone]

## 2017-06-13 NOTE — Telephone Encounter (Signed)
Letter sign by DR. SEthi at front desk. Pt will return to work 07/01/2017.

## 2017-06-13 NOTE — Telephone Encounter (Signed)
Rn spoke with patient in lobby. Rn explain Dr. Pearlean BrownieSethi will be in the office next week. Also the letter can be revised for him to work from home for 3 weeks.

## 2017-06-17 NOTE — Telephone Encounter (Signed)
Rn revised letter for patient so he can work at home. Dr. Pearlean BrownieSethi reviewed letter, and sign it. Letter at front desk.

## 2017-06-21 NOTE — Telephone Encounter (Addendum)
Pt called he needs another letter that will release him to return to his physical place of work as of 07/01/17. Pt is requesting RN to call to discuss. Pt is aware Dr Pearlean BrownieSethi is at the hospital next week

## 2017-06-26 NOTE — Telephone Encounter (Signed)
See phone note on 06/13/2017.Pt was given letter to work from home till 07/01/2017. Now pt needs letter to work at his office. Pt aware of Dr.Sethi does not return t office till 07/01/2017.

## 2017-06-26 NOTE — Telephone Encounter (Signed)
Pt called re: letter request, he was made reminded that Dr Pearlean BrownieSethi is not in office this week. Pt has requested that Dr Pearlean BrownieSethi processes this letter as soon as possible re: him being able to return to work

## 2017-06-26 NOTE — Telephone Encounter (Signed)
DR. Pearlean BrownieSethi will do letter and give to patient before Monday.

## 2017-06-26 NOTE — Telephone Encounter (Signed)
Ok to do so

## 2017-06-26 NOTE — Telephone Encounter (Signed)
Message sent to Dr. Pearlean BrownieSethi.  Pt needs another note stating he return to the office 07/01/2017. Pt is working from home see previous phone calls, and letters written.

## 2017-06-27 NOTE — Telephone Encounter (Signed)
Rn call patient about letter being done he can return to work at his job. Pt was working from home. RN stated Dr. Pearlean BrownieSEthi came by and did letter but had to leave for hospital meeting. Pt stated the letter will have to be revised because he is having problems driving. Rn advised pt to call his employer, and discuss can he get and extension on working from home. Pt will call back tomorrow and leave message. He knows Dr. Pearlean BrownieSethi will be in the office on Monday.

## 2017-06-27 NOTE — Telephone Encounter (Signed)
Letter sign and done by Dr. Pearlean BrownieSethi. Letter at front desk for patient to pick up.

## 2017-06-28 ENCOUNTER — Telehealth: Payer: Self-pay | Admitting: *Deleted

## 2017-06-28 NOTE — Telephone Encounter (Signed)
Pt p/u letter on Friday.

## 2017-07-01 NOTE — Telephone Encounter (Signed)
RN call patient about the letter. Patient stated he pick the letter up that Dr. Pearlean BrownieSethi wrote on 06/26/2017 on 06/28/2017. Patient stated the letter was fine, and he did not need another one at this time. Rn verbalized understanding.

## 2017-07-25 ENCOUNTER — Telehealth: Payer: Self-pay | Admitting: Neurology

## 2017-07-25 NOTE — Telephone Encounter (Signed)
Pt has asked that this message be forwarded to Dr Marlis EdelsonSethi's RN.  He would like a call back to know if it is safe to take a supplement called co2q10, something having to do with heart health, please call.  Pt made aware that Dr Pearlean BrownieSethi is not in office until Monday of next week

## 2017-07-25 NOTE — Telephone Encounter (Signed)
Dr. Pearlean BrownieSethi please advise I can call patient back. Thanks

## 2017-07-26 NOTE — Telephone Encounter (Signed)
Yes it is safe and beneficial to take coq10 especially if you are taking a cholesterol medicine as it may redice incidence of muscle aches with cholesterol pills

## 2017-07-29 NOTE — Telephone Encounter (Signed)
Left vm for patient to call back during business hours to give Dr. Pearlean BrownieSethi message on Coq10

## 2017-07-30 NOTE — Telephone Encounter (Signed)
Left 2nd vm for patient to call back about COQ10.

## 2017-07-31 NOTE — Telephone Encounter (Signed)
Left vm on patients cell that its safe to take COQ10 with his cholesterol medications.Rn left message if he had any other questions.

## 2017-09-09 NOTE — Progress Notes (Deleted)
[facility]  PATIENT: Roger [name] DOB: [birth year]   REASON FOR VISIT: *** HISTORY FROM:    HISTORY OF PRESENT ILLNESS:Initial Consult 04/25/2015 PS: Roger [name] is a 52 year old male whose had for the last 3 weeks subacute progressive paresthesias mostly in the left hand but to lesser degree in the right hand as well. He states this began about 3 weeks ago when he noticed weakness on his whole left side not just his hand this lasted only 10 minutes and recovered he subsequently had tickling sensation in his left hand and to lesser extent the right hand. Next a noticed a little heaviness on the right side and his right leg was buckling. He was seen in the emergency room at [facility] on 04/05/2015 and had unremarkable exam. Basic metabolic panel labs and CBC unremarkable. CT scan of the head which have personally reviewed was normal. He was subsequently seen by his primary physician and underwent a bunch of tests including carotid ultrasound, transthoracic echo and nuclear stress test all of which were normal.I do not have the results to review today. He had lab work done on 04/05/15 growing vitamin B12 which came slightly low-normal at 209. Patient has noticed for the last several days that he stumbles a lot. His gait is off balance. At times he can even drop objects from his left hand is he has so much tingling. This tingling seems to be more pronounced at night and at times he cannot sleep because of this. 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 He does have long-standing history of diabetes in which was poorly controlled in the past with lasted about an A1c being 7.9 but after being started on insulin he is doing better and more recently his fasting sugars have ranged from 90-120. He does have history of mild tingling in his feet but he does not feel that this has gotten worse. He has not tried any specific  medications for paresthesias in his hand. He denies history of any head injury, headache, neck pain, radicular pain. He does admit to significant fatigue over the last several months but denies any loss of vision, vertigo, diplopia, bladder urgency or incontinence. Update 05/23/2015 PS: He returns for follow-up after last visit 4 weeks ago. He was admitted to [facility] on 05/13/2015 with increased left hand weakness and numbness and 2 weeks ago had previous episode of transient speech difficulties. He was seen in the ER and that time and tone.Marland Kitchen MRI scan showed a right parietal deep white matter 2-3 cm subacute infarct extending into the posterior frontal lobe. 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 He was started on aspirin and Plavix together carotid ultrasound and transthoracic echo no source of embolism identified. He had outpatient cardiac monitor as well as transesophageal echocardiogram both of which did not reveal atrial fibrillation no cardiac source of embolism. Lipid panel and hemoglobin 1107 normal. He was seen by physical occupational speech therapy. He states his done well since discharge is tolerating aspirin and Plavix with minor bruising but no bleeding episodes. He is exercising and watching his diet has lost 15 pounds. He has also been getting B12 shots and feels it has overall that his tingling numbness as well as is walking better. Update 11/21/2015 PS: He returns for follow-up after last visit 6 months ago. He continues to do well without recurrent  stroke as he TIA symptoms. He states his blood pressure is well controlled at home and today it is 139/91. His fasting sugars have all been in the low 100 range and last him about an A1c was 7.1 a few months ago. He states his last lipid profile also was satisfactory. He does use CPAP at most nights but not consistently. He has not been as active as he would like to be and would  like tighter diet control and weight loss. He still remains on aspirin and Plavix and is tolerating it well without significant bruising or bleeding. He has no new complaints.  UPDATE 09/25/2017CM Roger [name], 52 year old returns for follow-up. He has a history of stroke event which occurred in September 2016. He is currently on Plavix for secondary stroke prevention without further stroke or TIA symptoms. No bruising noted. In addition he has obstructive sleep apnea and has not been using CPAP. This is followed by his primary Dr. Meredeth IdeFleming. He was made aware that this is a risk factor for stroke. Blood pressure is 137/94 the office today. He claims his diabetes is in better control. I do not have any results He has stopped exercising for a while but is now back to walking twice a day. He has a sedentary job. He remains on Zocor for hyperlipidemia without complaints of myalgias. He returns for reevaluation without any neurologic complaints. Most recent carotid Doppler 02/22/2016 was normal. He returns for reevaluation  UPDATE 06/05/2017 : He returns for follow-up after recent admission to [facility] for stroke in September 2018. I have reviewed hospital electronic medical records as well as personally reviewed imaging films. He presented there with dizziness, gait imbalance and left leg weakness. 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 MRA of the brain showed chronically occluded left vertebral artery and bilateral intracranial stenosis involving basilar artery in the midportion as well as potentially occluded left M1 segment which was unchanged compared with previous CT angiogram from 9/17,016. Transthoracic echo showed normal ejection fraction without cardiac source of embolism. Hemoglobin A1c was 6.8 and LDL cholesterol was 64 mg percent. The patient's feels he had this present stroke because a couple of weeks prior to his stroke his blood sugars and blood pressure  were both running quite high. He also admits to not being compliant with using his CPAP every night. Patient was previously on Plavix and aspirin 81 mg was added to current admission. Patient states he still has some residual numbness on the left side of his face left leg incoordination but overall seems to be gradually improving. He has started working part-time from his home   REVIEW OF SYSTEMS: Full 14 system review of systems performed and notable only for those listed, all others are neg:  Constitutional: neg  Cardiovascular: neg Ear/Nose/Throat: neg  Skin: neg Eyes: neg Respiratory: neg Gastroitestinal: neg  Hematology/Lymphatic: neg  Endocrine: neg Musculoskeletal:neg Allergy/Immunology: neg Neurological: neg Psychiatric: neg Sleep : neg   ALLERGIES: No Known Allergies  HOME MEDICATIONS: Outpatient Medications Prior to Visit  Medication Sig Dispense Refill  . albuterol (PROVENTIL HFA;VENTOLIN HFA) 108 (90 BASE) MCG/ACT inhaler Inhale 2 puffs into the lungs every 6 (six) hours as needed for wheezing or shortness of breath.    Marland Kitchen. amLODipine (NORVASC) 10 MG tablet Take 10 mg by mouth.    Marland Kitchen. aspirin EC 81 MG EC tablet Take 1 tablet (81 mg total) by mouth daily. 30 tablet 0  . atorvastatin (LIPITOR) 20 MG tablet  Take 1 tablet (20 mg total) by mouth daily at 6 PM. 30 tablet 0  . clopidogrel (PLAVIX) 75 MG tablet Take 1 tablet (75 mg total) by mouth daily. 30 tablet 11  . Exenatide ER (BYDUREON) 2 MG SRER Inject 2 mg into the skin once a week. Pt uses on Sunday.    . furosemide (LASIX) 20 MG tablet Take 40 mg by mouth daily.     Marland Kitchen glucose blood (BAYER CONTOUR NEXT TEST) test strip use three times a day    . insulin glargine (LANTUS) 100 UNIT/ML injection Inject 0.24 mLs (24 Units total) into the skin daily. 10 mL 11  . losartan (COZAAR) 100 MG tablet Take 100 mg by mouth daily.    . metFORMIN (GLUCOPHAGE) 1000 MG tablet Take 1 tablet (1,000 mg total) by mouth 2 (two) times daily  with a meal. 60 tablet 0  . metoprolol succinate (TOPROL-XL) 50 MG 24 hr tablet Take 50 mg by mouth daily.     Marland Kitchen NEEDLE, DISP, 27 G 27G X 1/2" MISC Use with B12 injection    . niacin (NIASPAN) 500 MG CR tablet Take 1,500 mg by mouth at bedtime.    Letta Pate DELICA LANCETS FINE MISC Inject into the skin.    Marland Kitchen SYRINGE-NEEDLE, DISP, 3 ML (BD ECLIPSE SYRINGE) 25G X 1" 3 ML MISC Use with B12 injection     No facility-administered medications prior to visit.     PAST MEDICAL HISTORY: Past Medical History:  Diagnosis Date  . B12 deficiency   . Diabetes mellitus without complication (HCC)    insulin-dependent  . Hyperlipidemia   . Hypertension   . Obesity   . OSA on CPAP   . RAD (reactive airway disease)   . Stroke [facility])     PAST SURGICAL HISTORY: Past Surgical History:  Procedure Laterality Date  . EYE SURGERY Right    Corneal implant    FAMILY HISTORY: Family History  Problem Relation Age of Onset  . Diabetes Mother     SOCIAL HISTORY: Social History   Socioeconomic History  . Marital status: Single    Spouse name: Not on file  . Number of children: Not on file  . Years of education: Not on file  . Highest education level: Not on file  Social Needs  . Financial resource strain: Not on file  . Food insecurity - worry: Not on file  . Food insecurity - inability: Not on file  . Transportation needs - medical: Not on file  . Transportation needs - non-medical: Not on file  Occupational History  . Not on file  Tobacco Use  . Smoking status: Never Smoker  . Smokeless tobacco: Never Used  Substance and Sexual Activity  . Alcohol use: No    Alcohol/week: 0.0 oz  . Drug use: No  . Sexual activity: Not on file  Other Topics Concern  . Not on file  Social History Narrative   Caffeine none.     Right handed.   [employer]- programmer   Live in home   No children.       PHYSICAL EXAM  There were no vitals filed for this visit. There is no height or weight on  file to calculate BMI.  Generalized: Well developed, in no acute distress  Head: normocephalic and atraumatic,. Oropharynx benign  Neck: Supple, no carotid bruits  Cardiac: Regular rate rhythm, no murmur  Musculoskeletal: No deformity   Neurological examination   Mentation: Alert oriented to time,  place, history taking. Attention span and concentration appropriate. Recent and remote memory intact.  Follows all commands speech and language fluent.   Cranial nerve II-XII: Fundoscopic exam reveals sharp disc margins.Pupils were equal round reactive to light extraocular movements were full, visual field were full on confrontational test. Facial sensation and strength were normal. hearing was intact to finger rubbing bilaterally. Uvula tongue midline. head turning and shoulder shrug were normal and symmetric.Tongue protrusion into cheek strength was normal. Motor: normal bulk and tone, full strength in the BUE, BLE, fine finger movements normal, no pronator drift. No focal weakness Sensory: normal and symmetric to light touch, pinprick, and  Vibration, proprioception  Coordination: finger-nose-finger, heel-to-shin bilaterally, no dysmetria Reflexes: Brachioradialis 2/2, biceps 2/2, triceps 2/2, patellar 2/2, Achilles 2/2, plantar responses were flexor bilaterally. Gait and Station: Rising up from seated position without assistance, normal stance,  moderate stride, good arm swing, smooth turning, able to perform tiptoe, and heel walking without difficulty. Tandem gait is steady  DIAGNOSTIC DATA (LABS, IMAGING, TESTING) - I reviewed patient records, labs, notes, testing and imaging myself where available.  Lab Results  Component Value Date   WBC 10.2 05/24/2017   HGB 14.0 05/24/2017   HCT 40.5 05/24/2017   MCV 95.0 05/24/2017   PLT 278 05/24/2017      Component Value Date/Time   NA 140 05/24/2017 0550   K 3.9 05/24/2017 0550   CL 105 05/24/2017 0550   CO2 28 05/24/2017 0550   GLUCOSE 162  (H) 05/24/2017 0550   BUN 14 05/24/2017 0550   CREATININE 0.66 05/24/2017 0550   CALCIUM 8.8 (L) 05/24/2017 0550   PROT 7.9 05/23/2017 1225   ALBUMIN 4.3 05/23/2017 1225   AST 30 05/23/2017 1225   ALT 25 05/23/2017 1225   ALKPHOS 56 05/23/2017 1225   BILITOT 1.1 05/23/2017 1225   GFRNONAA >60 05/24/2017 0550   GFRAA >60 05/24/2017 0550   Lab Results  Component Value Date   CHOL 152 05/23/2017   HDL 33 (L) 05/23/2017   LDLCALC 64 05/23/2017   TRIG 273 (H) 05/23/2017   CHOLHDL 4.6 05/23/2017   Lab Results  Component Value Date   HGBA1C 6.8 (H) 05/23/2017   Lab Results  Component Value Date   VITAMINB12 1,471 (H) 05/23/2017   Lab Results  Component Value Date   TSH 1.475 05/23/2017      ASSESSMENT AND PLAN   52 year male with history of progressive left hand paresthesias as well as more recently gait and balance difficulties with frequent falls due to 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 Vascular risk factors of hypertension, diabetes, obesity, sleep apnea and intracranial atherosclerosis.      I had a long d/w patient and his wife about his recent  medullary stroke,intracranial stenosis risk for recurrent stroke/TIAs, personally independently reviewed imaging studies and stroke evaluation results and answered questions.Continue aspirin 81 mg daily and Plavix 75 mg daily x 3 months and then stop Plavix and change to aspirin 325 mg daily alone  for secondary stroke prevention and maintain strict control of hypertension with blood pressure goal below 130/90, diabetes with hemoglobin A1c goal below 6.5% and lipids with LDL cholesterol goal below 70 mg/dL. I also advised the patient to eat a healthy diet with plenty of whole grains, cereals, fruits and  vegetables,  exercise regularly and maintain ideal body weight .I advised the patient to be compliant with his CPAP and to use it every night. I do not believe repeat CT angiogram of the brain and neck is necessary as he has a known chronic left vertebral artery occlusion and the present    Nilda Riggs, [facility], APRN  [facility] [address] [place], [place] [address] [phone]

## 2017-09-10 ENCOUNTER — Telehealth: Payer: Self-pay | Admitting: *Deleted

## 2017-09-10 ENCOUNTER — Ambulatory Visit: Payer: Commercial Managed Care - PPO | Admitting: Nurse Practitioner

## 2017-09-10 NOTE — Telephone Encounter (Signed)
Patient was no show for FU today with NP. 

## 2017-09-11 ENCOUNTER — Encounter: Payer: Self-pay | Admitting: Nurse Practitioner

## 2018-05-10 ENCOUNTER — Encounter: Payer: Self-pay | Admitting: Emergency Medicine

## 2018-05-10 ENCOUNTER — Other Ambulatory Visit: Payer: Self-pay

## 2018-05-10 ENCOUNTER — Emergency Department
Admission: EM | Admit: 2018-05-10 | Discharge: 2018-05-10 | Disposition: A | Discharge status: Home / Self Care | Payer: Commercial Managed Care - PPO | Attending: Emergency Medicine | Admitting: Emergency Medicine

## 2018-05-10 DIAGNOSIS — Z79899 Other long term (current) drug therapy: Secondary | ICD-10-CM | POA: Diagnosis not present

## 2018-05-10 DIAGNOSIS — R2231 Localized swelling, mass and lump, right upper limb: Secondary | ICD-10-CM | POA: Diagnosis present

## 2018-05-10 DIAGNOSIS — Z794 Long term (current) use of insulin: Secondary | ICD-10-CM | POA: Insufficient documentation

## 2018-05-10 DIAGNOSIS — Z8673 Personal history of transient ischemic attack (TIA), and cerebral infarction without residual deficits: Secondary | ICD-10-CM | POA: Insufficient documentation

## 2018-05-10 DIAGNOSIS — E785 Hyperlipidemia, unspecified: Secondary | ICD-10-CM | POA: Insufficient documentation

## 2018-05-10 DIAGNOSIS — L03011 Cellulitis of right finger: Secondary | ICD-10-CM | POA: Diagnosis not present

## 2018-05-10 DIAGNOSIS — Z7902 Long term (current) use of antithrombotics/antiplatelets: Secondary | ICD-10-CM | POA: Diagnosis not present

## 2018-05-10 DIAGNOSIS — Z7982 Long term (current) use of aspirin: Secondary | ICD-10-CM | POA: Diagnosis not present

## 2018-05-10 DIAGNOSIS — E119 Type 2 diabetes mellitus without complications: Secondary | ICD-10-CM | POA: Diagnosis not present

## 2018-05-10 DIAGNOSIS — I10 Essential (primary) hypertension: Secondary | ICD-10-CM | POA: Insufficient documentation

## 2018-05-10 MED ORDER — LIDOCAINE HCL (PF) 1 % IJ SOLN
INTRAMUSCULAR | Status: AC
  Administered 2018-05-10: 18:00:00
  Filled 2018-05-10: qty 5

## 2018-05-10 MED ORDER — TRAMADOL HCL 50 MG PO TABS: 50.0000 mg | ORAL_TABLET | Freq: Two times a day (BID) | ORAL | 0 refills | Status: AC | PRN

## 2018-05-10 MED ORDER — SULFAMETHOXAZOLE-TRIMETHOPRIM 800-160 MG PO TABS: 1.0000 | ORAL_TABLET | Freq: Two times a day (BID) | ORAL | 0 refills | Status: DC

## 2018-05-10 NOTE — Discharge Instructions (Signed)
Follow discharge care instruction and do Epson salt salt as directed.  Leave dressing on overnight and then apply Band-Aids.

## 2018-05-10 NOTE — ED Triage Notes (Signed)
Swelling to right middle finger just under nail.

## 2018-05-10 NOTE — ED Notes (Signed)
NAD noted at time of D/C. Pt denies questions or concerns. Pt ambulatory to the lobby at this time.  

## 2018-05-10 NOTE — ED Provider Notes (Signed)
[facility] Emergency Department Provider Note   ____________________________________________   First MD Initiated Contact with Patient 05/10/18 1717     (approximate)  I have reviewed the triage vital signs and the nursing notes.   HISTORY  Chief Complaint Hand Pain    HPI Roger [name] is a 52 y.o. male patient presents with pain and swelling and drainage from the nailbed of the third digit right hand.  Patient has just a second occurrence of this complaint same finger 6 weeks.  Patient is a previous treatment consists of antibiotics and warm soaks with Epson salt.  Patient state finger improved but complaint never completely resolved.  Patient rates pain as a 5/10.  Patient described the pain is "achy".  No palliative measure for complaint.  Past Medical History:  Diagnosis Date  . B12 deficiency   . Diabetes mellitus without complication (HCC)    insulin-dependent  . Hyperlipidemia   . Hypertension   . Obesity   . OSA on CPAP   . RAD (reactive airway disease)   . Stroke [facility])     Patient Active Problem List   Diagnosis Date Noted  . Embolic stroke (HCC) 05/23/2015  . Intracranial atherosclerosis 05/23/2015  . CVA (cerebral infarction) 05/16/2015  . TIA (transient ischemic attack) 05/13/2015  . HTN (hypertension) 05/13/2015  . HLD (hyperlipidemia) 05/13/2015  . OSA on CPAP 05/13/2015  . Type 2 diabetes mellitus (HCC) 05/13/2015  . Tingling in extremities 04/25/2015  . Carpal tunnel syndrome 04/25/2015  . 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 B12 deficiency 04/25/2015    Past Surgical History:  Procedure Laterality Date  . EYE SURGERY Right    Corneal implant    Prior to Admission medications   Medication Sig Start Date End Date Taking? Authorizing Provider  albuterol (PROVENTIL HFA;VENTOLIN HFA) 108 (90 BASE) MCG/ACT inhaler Inhale 2 puffs into the lungs every 6 (six) hours as needed for wheezing or shortness of breath.     [provider]  amLODipine (NORVASC) 10 MG tablet Take 10 mg by mouth. 05/19/15   [provider]  aspirin EC 81 MG EC tablet Take 1 tablet (81 mg total) by mouth daily. 05/26/17   Katha Hamming, MD  atorvastatin (LIPITOR) 20 MG tablet Take 1 tablet (20 mg total) by mouth daily at 6 PM. 05/25/17   Katha Hamming, MD  clopidogrel (PLAVIX) 75 MG tablet Take 1 tablet (75 mg total) by mouth daily. 05/21/16   Nilda Riggs, NP  Exenatide ER (BYDUREON) 2 MG SRER Inject 2 mg into the skin once a week. Pt uses on Sunday.    [provider]  furosemide (LASIX) 20 MG tablet Take 40 mg by mouth daily.     [provider]  glucose blood (BAYER CONTOUR NEXT TEST) test strip use three times a day 09/03/14   [provider]  insulin glargine (LANTUS) 100 UNIT/ML injection Inject 0.24 mLs (24 Units total) into the skin daily. 05/25/17   Katha Hamming, MD  losartan (COZAAR) 100 MG tablet Take 100 mg by mouth daily.    [provider]  metFORMIN (GLUCOPHAGE) 1000 MG tablet Take 1 tablet (1,000 mg total) by mouth 2 (two) times daily with a meal. 05/14/15   Gale Journey, MD  metoprolol succinate (TOPROL-XL) 50 MG 24 hr tablet Take 50 mg by mouth daily.     [provider]  NEEDLE, DISP, 27 G 27G X 1/2" MISC Use with B12 injection 04/28/15  [provider]  niacin (NIASPAN) 500 MG CR tablet Take 1,500 mg by mouth at bedtime.    [provider]  [facility] DELICA LANCETS FINE MISC Inject into the skin. 04/22/15   [provider]  sulfamethoxazole-trimethoprim (BACTRIM DS,SEPTRA DS) 800-160 MG tablet Take 1 tablet by mouth 2 (two) times daily. 05/10/18   Joni ReiningSmith, Elena Cothern K, PA-C  SYRINGE-NEEDLE, DISP, 3 ML (BD ECLIPSE SYRINGE) 25G X 1" 3 ML MISC Use with B12 injection 04/28/15   [provider]  traMADol (ULTRAM) 50 MG tablet Take 1 tablet (50 mg total) by mouth every 12 (twelve) hours as needed. 05/10/18    Joni ReiningSmith, Kiren Mcisaac K, PA-C    Allergies Patient has no known allergies.  Family History  Problem Relation Age of Onset  . Diabetes Mother     Social History Social History   Tobacco Use  . Smoking status: Never Smoker  . Smokeless tobacco: Never Used  Substance Use Topics  . Alcohol use: No    Alcohol/week: 0.0 standard drinks  . Drug use: No    Review of Systems Constitutional: No fever/chills Eyes: No visual changes. ENT: No sore throat. Cardiovascular: Denies chest pain. Respiratory: Denies shortness of breath. Gastrointestinal: No abdominal pain.  No nausea, no vomiting.  No diarrhea.  No constipation. Genitourinary: Negative for dysuria. Musculoskeletal: Negative for back pain. Skin: Negative for rash.  Edema and erythema right middle finger. Neurological: Negative for headaches, focal weakness or numbness. Endocrine:Diabetes, hyperlipidemia, and hypertension. ____________________________________________   PHYSICAL EXAM:  VITAL SIGNS: ED Triage Vitals [05/10/18 1707]  Enc Vitals Group     BP      Pulse      Resp      Temp      Temp src      SpO2      Weight 227 lb 8.2 oz (103.2 kg)     Height 5\' 7"  (1.702 m)     Head Circumference      Peak Flow      Pain Score 5     Pain Loc      Pain Edu?      Excl. in GC?     Constitutional: Alert and oriented. Well appearing and in no acute distress. Cardiovascular: Normal rate, regular rhythm. Grossly normal heart sounds.  Good peripheral circulation. Respiratory: Normal respiratory effort.  No retractions. Lungs CTAB. Musculoskeletal: No lower extremity tenderness nor edema.  No joint effusions. Neurologic:  Normal speech and language. No gross focal neurologic deficits are appreciated. No gait instability. Skin: Edema and erythema right third finger nail bed.   Psychiatric: Mood and affect are normal. Speech and behavior are normal.  ____________________________________________   LABS (all labs ordered are  listed, but only abnormal results are displayed)  Labs Reviewed - No data to display ____________________________________________  EKG   ____________________________________________  RADIOLOGY  ED MD interpretation:    Official radiology report(s): No results found.  ____________________________________________   PROCEDURES  Procedure(s) performed: None  .Marland Kitchen.Incision and Drainage Date/Time: 05/10/2018 5:35 PM Performed by: Joni ReiningSmith, Kail Fraley K, PA-C Authorized by: Joni ReiningSmith, Jaquis Picklesimer K, PA-C   Consent:    Consent obtained:  Verbal   Consent given by:  Patient   Risks discussed:  Bleeding, incomplete drainage and pain Location:    Type:  Abscess   Location:  Upper extremity   Upper extremity location:  Finger   Finger location:  R long finger Pre-procedure details:    Skin preparation:  Betadine Anesthesia (see MAR for  exact dosages):    Anesthesia method:  Nerve block   Block needle gauge:  25 G   Block anesthetic:  Lidocaine 1% w/o epi   Block injection procedure:  Anatomic landmarks identified   Block outcome:  Anesthesia achieved Procedure type:    Complexity:  Simple Procedure details:    Incision types:  Stab incision   Incision depth:  Subcutaneous   Scalpel blade:  11   Drainage:  Purulent   Drainage amount:  Scant   Wound treatment:  Wound left open Post-procedure details:    Patient tolerance of procedure:  Tolerated well, no immediate complications    Critical Care performed: No  ____________________________________________   INITIAL IMPRESSION / ASSESSMENT AND PLAN / ED COURSE  As part of my medical decision making, I reviewed the following data within the electronic MEDICAL RECORD NUMBER    Pain, erythema and edema to the third digit left hand. Paronychia required incision and drainage.  Patient given discharge care instruction advised take medication as directed.  Patient advised follow-up PCP.      ____________________________________________   FINAL CLINICAL IMPRESSION(S) / ED DIAGNOSES  Final diagnoses:  Paronychia of right middle finger     ED Discharge Orders         Ordered    sulfamethoxazole-trimethoprim (BACTRIM DS,SEPTRA DS) 800-160 MG tablet  2 times daily     05/10/18 1747    traMADol (ULTRAM) 50 MG tablet  Every 12 hours PRN     05/10/18 1747           Note:  This document was prepared using Dragon voice recognition software and may include unintentional dictation errors.    Joni Reining, PA-C 05/10/18 1751    Minna Antis, MD 05/11/18 1756

## 2019-06-27 IMAGING — MR MR MRA HEAD W/O CM
1 series · 22 of 48 positions shown · non-contrast
Comparison: Carotid ultrasound May 23, 2017 and MRA head
May 14, 2015 and CT angiogram of the head and neck Veroljub Vecko

CLINICAL DATA: Dizziness, follow-up stroke. History of occluded
LEFT vertebral artery.

EXAM:
MRA HEAD WITHOUT CONTRAST
TECHNIQUE: Angiographic images of the Circle of Willis were obtained using MRA
technique without intravenous contrast.

[Series 3: TOF · axial · non-contrast · 0.7mm · 0.37mm/px · z∈[-27,+67]mm · 22 of 143 slices shown]
[im 1/143]
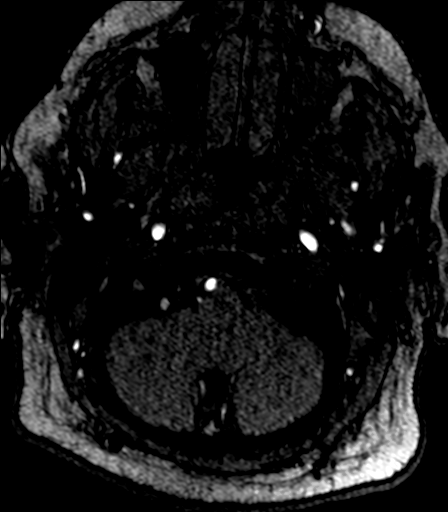
[im 4/143]
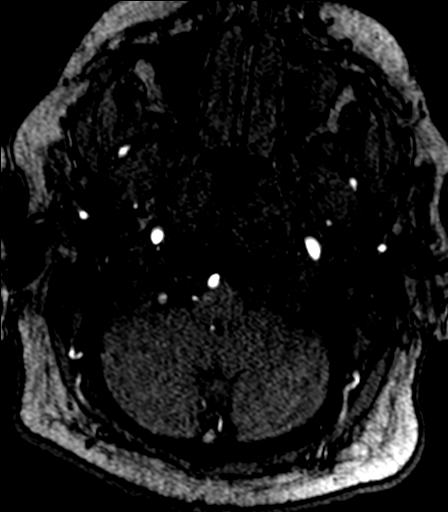
[im 7/143]
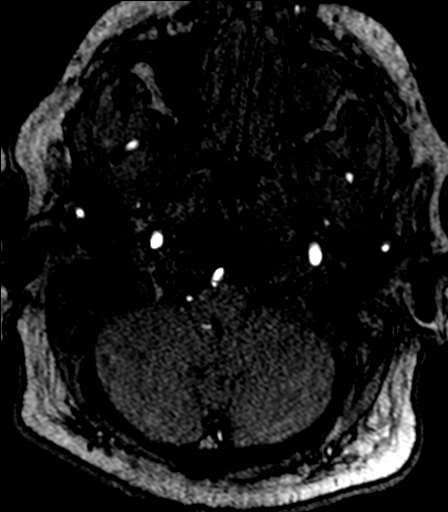
[im 10/143]
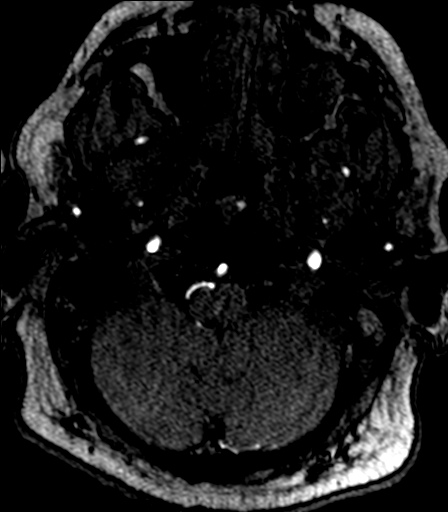
[im 13/143]
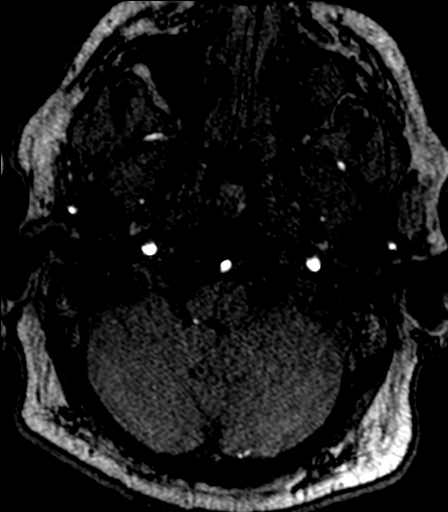
[im 16/143]
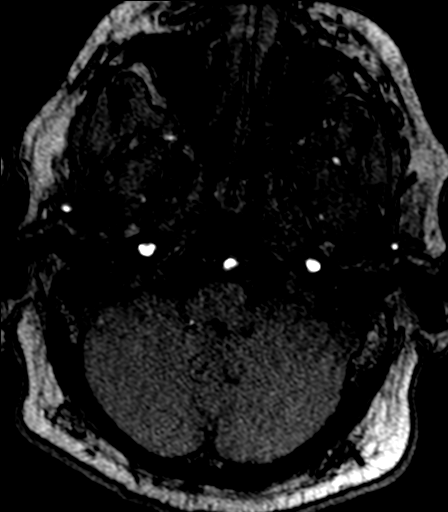
[im 19/143]
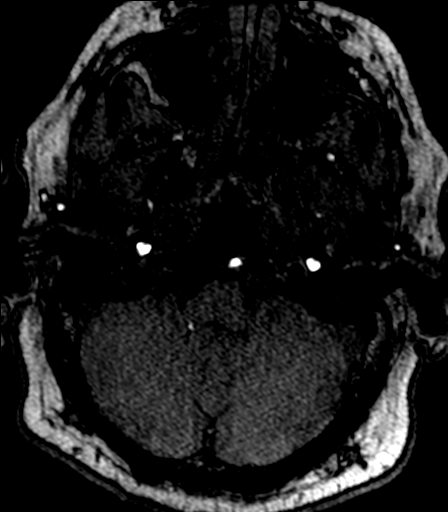
[im 22/143]
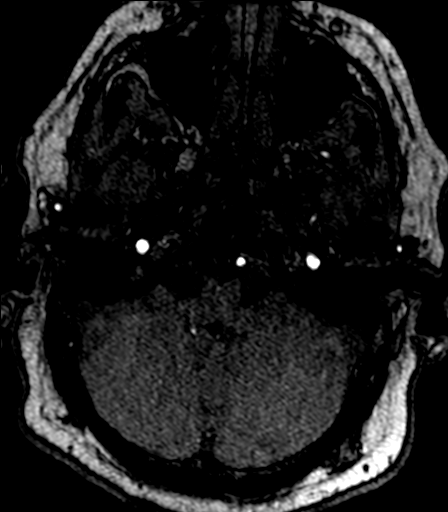
[im 25/143]
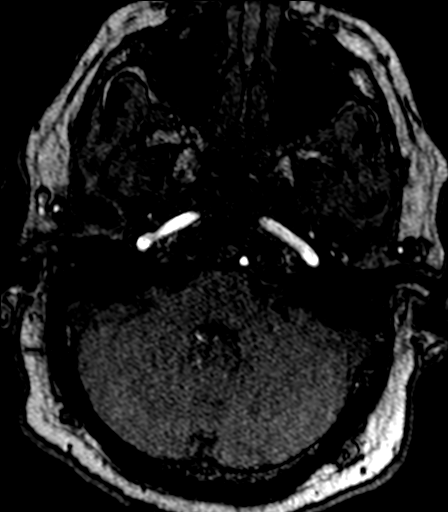
[im 28/143]
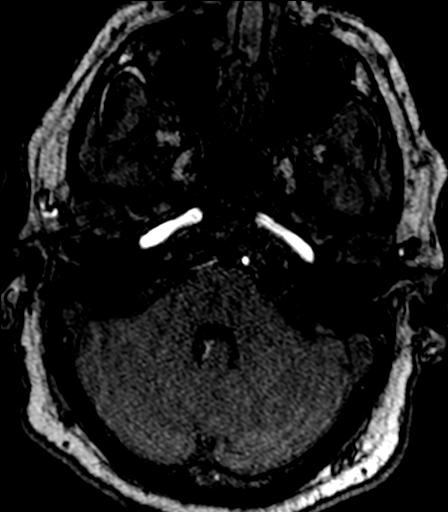
[im 31/143]
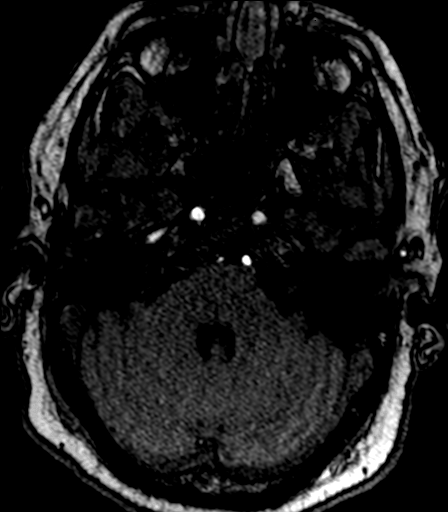
[im 34/143]
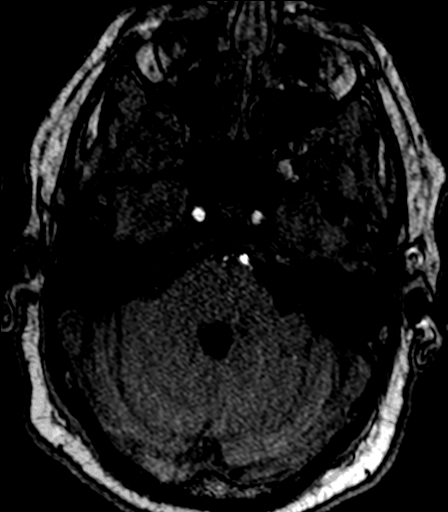
[im 37/143]
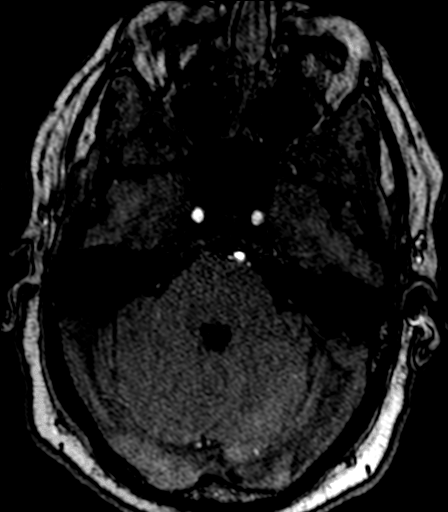
[im 40/143]
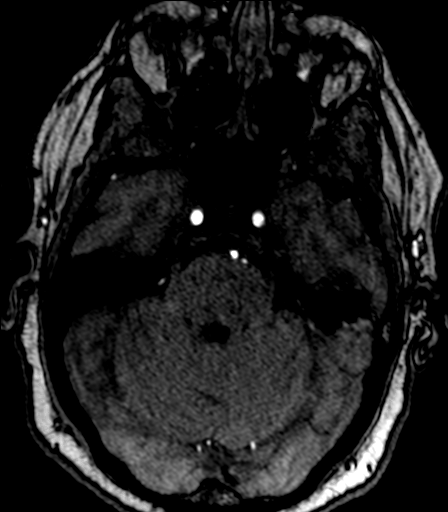
[im 46/143]
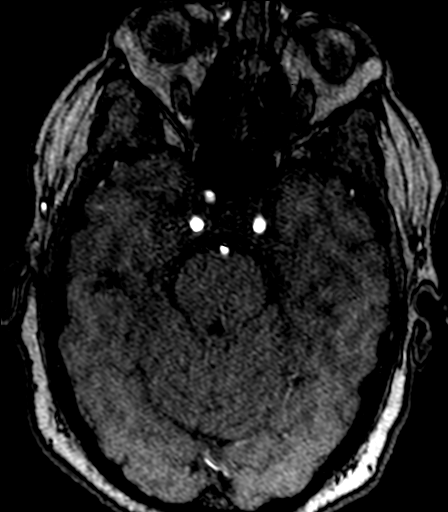
[im 64/143]
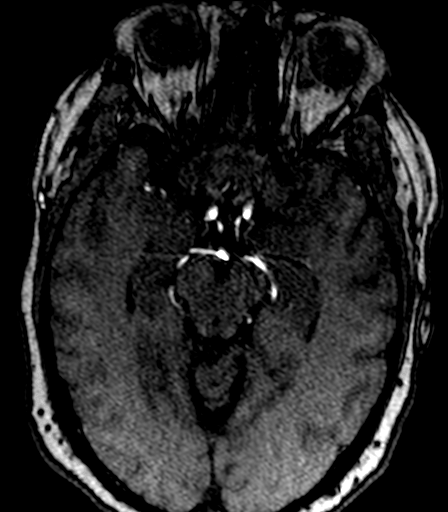
[im 73/143]
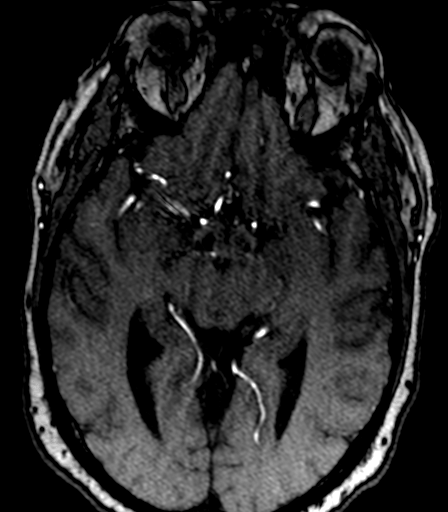
[im 82/143]
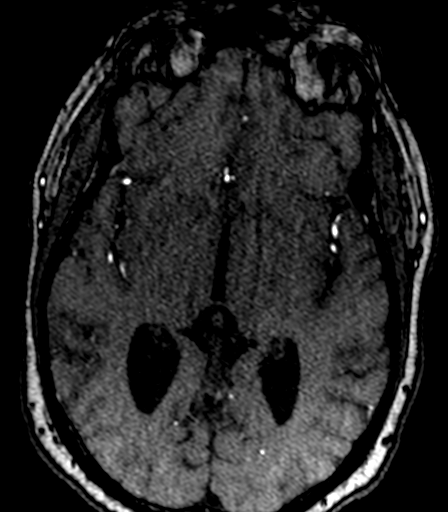
[im 100/143]
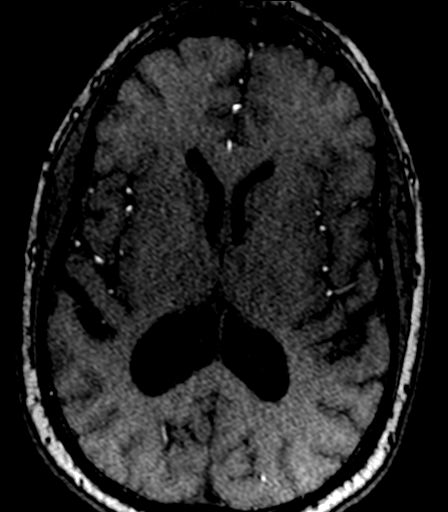
[im 118/143]
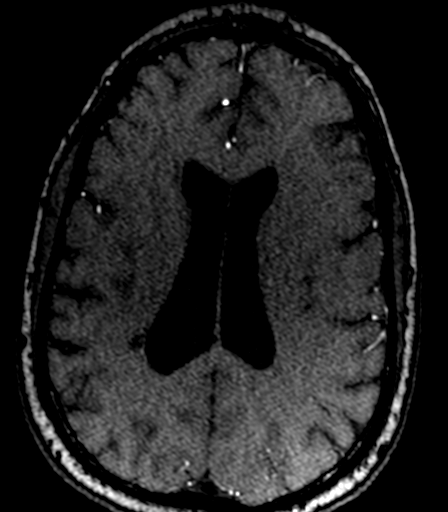
[im 121/143]
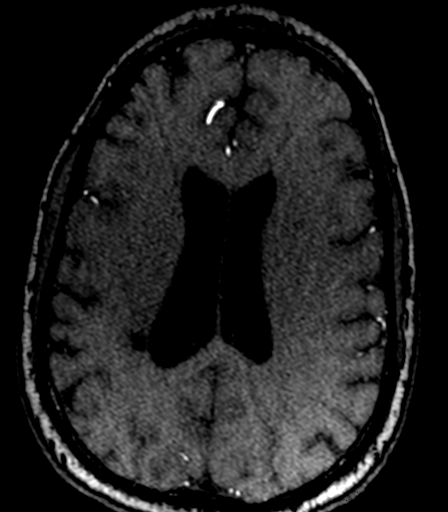
[im 136/143]
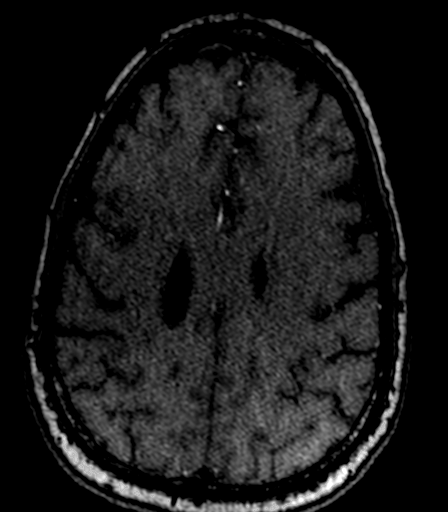

[22 of 48 positions shown; findings below may reference images not displayed]

FINDINGS: ANTERIOR CIRCULATION: Normal flow related enhancement of the
included cervical, petrous, cavernous and supraclinoid internal
carotid arteries. Patent anterior communicating artery. Patent
anterior and middle cerebral arteries, including distal segments.
Multifocal severe stenosis anterior cerebral artery's. Severe
stenosis LEFT middle cerebral artery versus focally occluded. 10 mm
moderate stenoses RIGHT middle cerebral artery.

No aneurysm.

POSTERIOR CIRCULATION: No flow related enhancement included LEFT
vertebral artery though, proximal V4 segments not imaged. LEFT
posterior-inferior cerebellar artery not present, unchanged. Severe
stenosis mid basilar artery. Basilar artery is patent, remaining
main branch vessels. Severe tandem stenoses RIGHT posterior cerebral
artery, moderate on the LEFT. Robust bilateral posterior
communicating artery's demonstrated.

No aneurysm.

ANATOMIC VARIANTS: None.

Source images and MIP images were reviewed.
IMPRESSION: 1. Chronically occluded LEFT vertebral artery and LEFT
posterior-inferior cerebellar artery.
2. Multifocal severe stenoses cerebral artery's and mid basilar
artery with potential focally occluded LEFT M1 segment, unchanged .
3. Given severity of findings, recommend repeat CT angiogram head
and neck for further characterization.

## 2022-11-21 ENCOUNTER — Encounter: Payer: Self-pay | Admitting: Colon & Rectal Surgery

## 2022-11-29 ENCOUNTER — Other Ambulatory Visit: Payer: Self-pay | Admitting: Colon & Rectal Surgery

## 2022-11-29 DIAGNOSIS — C20 Malignant neoplasm of rectum: Secondary | ICD-10-CM

## 2022-12-03 ENCOUNTER — Ambulatory Visit
Admission: RE | Admit: 2022-12-03 | Discharge: 2022-12-03 | Disposition: A | Discharge status: Home / Self Care | Payer: No Typology Code available for payment source | Source: Ambulatory Visit | Attending: Colon & Rectal Surgery | Admitting: Colon & Rectal Surgery

## 2022-12-03 DIAGNOSIS — C20 Malignant neoplasm of rectum: Secondary | ICD-10-CM | POA: Diagnosis present

## 2023-04-13 ENCOUNTER — Emergency Department: Payer: No Typology Code available for payment source

## 2023-04-13 ENCOUNTER — Other Ambulatory Visit: Payer: Self-pay

## 2023-04-13 ENCOUNTER — Emergency Department
Admission: EM | Admit: 2023-04-13 | Discharge: 2023-04-14 | Disposition: A | Discharge status: Home / Self Care | Payer: No Typology Code available for payment source | Attending: Emergency Medicine | Admitting: Emergency Medicine

## 2023-04-13 DIAGNOSIS — W540XXA Bitten by dog, initial encounter: Secondary | ICD-10-CM | POA: Insufficient documentation

## 2023-04-13 DIAGNOSIS — S81852A Open bite, left lower leg, initial encounter: Secondary | ICD-10-CM | POA: Diagnosis present

## 2023-04-13 LAB — CBC WITH DIFFERENTIAL/PLATELET
Abs Immature Granulocytes: 0.01 10*3/uL (ref 0.00–0.07)
Basophils Absolute: 0 10*3/uL (ref 0.0–0.1)
Basophils Relative: 0 %
Eosinophils Absolute: 0 10*3/uL (ref 0.0–0.5)
Eosinophils Relative: 2 %
HCT: 43.6 % (ref 39.0–52.0)
Hemoglobin: 14.5 g/dL (ref 13.0–17.0)
Immature Granulocytes: 0 %
Lymphocytes Relative: 9 %
Lymphs Abs: 0.2 10*3/uL — ABNORMAL LOW (ref 0.7–4.0)
MCH: 33.7 pg (ref 26.0–34.0)
MCHC: 33.3 g/dL (ref 30.0–36.0)
MCV: 101.4 fL — ABNORMAL HIGH (ref 80.0–100.0)
Monocytes Absolute: 0.3 10*3/uL (ref 0.1–1.0)
Monocytes Relative: 10 %
Neutro Abs: 2.1 10*3/uL (ref 1.7–7.7)
Neutrophils Relative %: 79 %
Platelets: 218 10*3/uL (ref 150–400)
RBC: 4.3 MIL/uL (ref 4.22–5.81)
RDW: 16.6 % — ABNORMAL HIGH (ref 11.5–15.5)
WBC: 2.6 10*3/uL — ABNORMAL LOW (ref 4.0–10.5)
nRBC: 0 % (ref 0.0–0.2)

## 2023-04-13 LAB — COMPREHENSIVE METABOLIC PANEL
ALT: 45 U/L — ABNORMAL HIGH (ref 0–44)
AST: 33 U/L (ref 15–41)
Albumin: 4.1 g/dL (ref 3.5–5.0)
Alkaline Phosphatase: 67 U/L (ref 38–126)
Anion gap: 12 (ref 5–15)
BUN: 14 mg/dL (ref 6–20)
CO2: 20 mmol/L — ABNORMAL LOW (ref 22–32)
Calcium: 9 mg/dL (ref 8.9–10.3)
Chloride: 105 mmol/L (ref 98–111)
Creatinine, Ser: 0.79 mg/dL (ref 0.61–1.24)
GFR, Estimated: >60 mL/min (ref >60)
Glucose, Bld: 208 mg/dL — ABNORMAL HIGH (ref 70–99)
Potassium: 3.9 mmol/L (ref 3.5–5.1)
Sodium: 137 mmol/L (ref 135–145)
Total Bilirubin: 1.5 mg/dL — ABNORMAL HIGH (ref 0.3–1.2)
Total Protein: 7.7 g/dL (ref 6.5–8.1)

## 2023-04-13 MED ORDER — AMOXICILLIN-POT CLAVULANATE 875-125 MG PO TABS: 1.0000 | ORAL_TABLET | Freq: Two times a day (BID) | ORAL | 0 refills | Status: AC

## 2023-04-13 MED ORDER — AMOXICILLIN-POT CLAVULANATE 875-125 MG PO TABS
1.0000 | ORAL_TABLET | Freq: Once | ORAL | Status: AC
  Administered 2023-04-13: 1 via ORAL
  Filled 2023-04-13: qty 1

## 2023-04-13 MED ORDER — LIDOCAINE HCL (PF) 1 % IJ SOLN
10.0000 mL | Freq: Once | INTRAMUSCULAR | Status: AC
  Administered 2023-04-13: 10 mL
  Filled 2023-04-13: qty 10

## 2023-04-13 MED ORDER — DOXYCYCLINE HYCLATE 50 MG PO CAPS: 100.0000 mg | ORAL_CAPSULE | Freq: Two times a day (BID) | ORAL | 0 refills | Status: AC

## 2023-04-13 MED ORDER — DOXYCYCLINE HYCLATE 100 MG PO TABS
100.0000 mg | ORAL_TABLET | Freq: Once | ORAL | Status: AC
  Administered 2023-04-13: 100 mg via ORAL
  Filled 2023-04-13: qty 1

## 2023-04-13 NOTE — ED Notes (Signed)
Gauze dressing applied to left calf at this time.

## 2023-04-13 NOTE — Discharge Instructions (Addendum)
Please keep your wound clean by washing at least daily with soap and water.  Apply antibiotic ointment and a bandage to the wounds. If you see any signs of infection like spreading redness, pus coming from the wound, extreme pain, fevers, chills or any other worsening doctor right away or come back to the emergency department.  Take antibiotics for the full 7-day course as prescribed.  Call local police or animal control to obtain vaccination records for the dog that bit you.  Work with them to either obtain these records or observe the animal for the appropriate time spent, and for further guidance on whether or not you will need rabies shots.  Thank you for choosing [place] for your health care today!  Please see your primary doctor this week for a follow up appointment.   If you have any new, worsening, or unexpected symptoms call your doctor right away or come back to the emergency department for reevaluation.  It was my pleasure to care for you today.   Daneil Dan Modesto Charon, MD

## 2023-04-13 NOTE — ED Provider Notes (Signed)
[facility] Provider Note    Event Date/Time   First MD Initiated Contact with Patient 04/13/23 2311     (approximate)   History   Animal Bite   HPI  Roger [name] is a 57 y.o. male   Past medical history of rectal cancer on chemotherapy and antiplatelet therapy for stroke who presents to the emergency department with a dog bite to his left lower leg.  Neighbors dog, believes vaccinations are up-to-date, called animal control who is observing the animal.  No other injuries.      Physical Exam   Triage Vital Signs: ED Triage Vitals  Encounter Vitals Group     BP 04/13/23 2219 (!) 155/100     Systolic BP Percentile --      Diastolic BP Percentile --      Pulse Rate 04/13/23 2219 (!) 102     Resp 04/13/23 2219 18     Temp 04/13/23 2219 98.9 F (37.2 C)     Temp Source 04/13/23 2219 Oral     SpO2 04/13/23 2219 98 %     Weight 04/13/23 2220 213 lb (96.6 kg)     Height 04/13/23 2220 5\' 7"  (1.702 m)     Head Circumference --      Peak Flow --      Pain Score 04/13/23 2219 4     Pain Loc --      Pain Education --      Exclude from Growth Chart --     Most recent vital signs: Vitals:   04/13/23 2319 04/14/23 0000  BP: (!) 142/85 126/77  Pulse:  85  Resp:    Temp:    SpO2:  98%    General: Awake, no distress.  CV:  Good peripheral perfusion.  Resp:  Normal effort.  Abd:  No distention.  Other:  Several small punctate wounds, superficial, to the left lower leg, hemostatic.   ED Results / Procedures / Treatments   Labs (all labs ordered are listed, but only abnormal results are displayed) Labs Reviewed  CBC WITH DIFFERENTIAL/PLATELET - Abnormal; Notable for the following components:      Result Value   WBC 2.6 (*)    MCV 101.4 (*)    RDW 16.6 (*)    Lymphs Abs 0.2 (*)    All other components within normal limits  COMPREHENSIVE METABOLIC PANEL - Abnormal; Notable for the following components:   CO2 20 (*)    Glucose, Bld 208  (*)    ALT 45 (*)    Total Bilirubin 1.5 (*)    All other components within normal limits     I ordered and reviewed the above labs they are notable for his white blood cell count is 2.6      RADIOLOGY I independently reviewed and interpreted x-ray of the lower leg and see no obvious subcutaneous gas or foreign body I also reviewed radiologist's formal read.   PROCEDURES:  Critical Care performed: No  Procedures   MEDICATIONS ORDERED IN ED: Medications  lidocaine (PF) (XYLOCAINE) 1 % injection 10 mL (10 mLs Other Given 04/13/23 2325)  amoxicillin-clavulanate (AUGMENTIN) 875-125 MG per tablet 1 tablet (1 tablet Oral Given 04/13/23 2350)  doxycycline (VIBRA-TABS) tablet 100 mg (100 mg Oral Given 04/13/23 2350)     IMPRESSION / MDM / ASSESSMENT AND PLAN / ED COURSE  I reviewed the triage vital signs and the nursing notes.  Patient's presentation is most consistent with acute presentation with potential threat to life or bodily function.  Differential diagnosis includes, but is not limited to, animal bite, infection, foreign body   The patient is on the cardiac monitor to evaluate for evidence of arrhythmia and/or significant heart rate changes.  MDM: Patient with high risk injury for infection due to dog bite and chemotherapy immunocompromise state.  Several punctate wounds to the lower leg, hemostatic.  Washed irrigated and bandaged.  Antibiotics for prophylaxis.  He will follow-up with PMD.       FINAL CLINICAL IMPRESSION(S) / ED DIAGNOSES   Final diagnoses:  Dog bite, initial encounter     Rx / DC Orders   ED Discharge Orders          Ordered    amoxicillin-clavulanate (AUGMENTIN) 875-125 MG tablet  2 times daily        04/13/23 2348    doxycycline (VIBRAMYCIN) 50 MG capsule  2 times daily        04/13/23 2348             Note:  This document was prepared using Dragon voice recognition software and may include  unintentional dictation errors.    Pilar Jarvis, MD 04/14/23 [phone]

## 2023-04-13 NOTE — ED Triage Notes (Addendum)
Pt w/ dog bite to left calf. Animal control was notified, owner was unsure of vaccination status. Pt has rectal cancer and is on chemotherapy- is concerned about white blood count per his doctor. Last tetanus 2 years ago. Pt is on blood thinner, no active bleeding at this time.
